# Patient Record
Sex: Female | Born: 1985
Health system: Southern US, Community
[De-identification: ages and names within clinical notes are randomized; demographics above are authoritative.]

## PROBLEM LIST (undated history)

## (undated) DIAGNOSIS — D690 Allergic purpura: Secondary | ICD-10-CM

## (undated) DIAGNOSIS — T783XXA Angioneurotic edema, initial encounter: Secondary | ICD-10-CM

## (undated) DIAGNOSIS — L509 Urticaria, unspecified: Secondary | ICD-10-CM

## (undated) DIAGNOSIS — M3500 Sicca syndrome, unspecified: Secondary | ICD-10-CM

## (undated) DIAGNOSIS — R112 Nausea with vomiting, unspecified: Secondary | ICD-10-CM

## (undated) DIAGNOSIS — N08 Glomerular disorders in diseases classified elsewhere: Secondary | ICD-10-CM

## (undated) DIAGNOSIS — Z9889 Other specified postprocedural states: Secondary | ICD-10-CM

## (undated) HISTORY — PX: MOUTH SURGERY: SHX715

## (undated) HISTORY — PX: LUMBAR DISC SURGERY: SHX700

## (undated) HISTORY — DX: Sjogren syndrome, unspecified: M35.00

## (undated) HISTORY — DX: Angioneurotic edema, initial encounter: T78.3XXA

## (undated) HISTORY — DX: Urticaria, unspecified: L50.9

## (undated) HISTORY — PX: LAMINOTOMY: SHX998

## (undated) HISTORY — PX: NO PAST SURGERIES: SHX2092

---

## 2008-05-05 ENCOUNTER — Inpatient Hospital Stay (HOSPITAL_COMMUNITY): Admission: AD | Admit: 2008-05-05 | Discharge: 2008-05-07 | Payer: Self-pay | Admitting: Obstetrics & Gynecology

## 2008-05-05 ENCOUNTER — Ambulatory Visit: Payer: Self-pay | Admitting: Obstetrics & Gynecology

## 2010-03-04 ENCOUNTER — Other Ambulatory Visit: Admission: RE | Admit: 2010-03-04 | Discharge: 2010-03-04 | Payer: Self-pay | Admitting: Obstetrics and Gynecology

## 2011-05-19 LAB — DIFFERENTIAL
Basophils Absolute: 0
Basophils Relative: 1
Eosinophils Absolute: 0.1
Eosinophils Relative: 1
Lymphocytes Relative: 20
Lymphs Abs: 2
Monocytes Absolute: 0.6
Monocytes Relative: 6
Neutro Abs: 7.4
Neutrophils Relative %: 73

## 2011-05-19 LAB — CBC
HCT: 34.8 — ABNORMAL LOW
HCT: 38.3
Hemoglobin: 11.6 — ABNORMAL LOW
Hemoglobin: 12.8
MCHC: 33.3
MCHC: 33.4
MCV: 88.4
MCV: 88.5
Platelets: 234
Platelets: 297
RBC: 3.94
RBC: 4.33
RDW: 15.2
RDW: 15.7 — ABNORMAL HIGH
WBC: 10.2
WBC: 11.9 — ABNORMAL HIGH

## 2011-05-19 LAB — RH IMMUNE GLOB WKUP(>/=20WKS)(NOT WOMEN'S HOSP): Fetal Screen: NEGATIVE

## 2011-05-19 LAB — RPR: RPR Ser Ql: NONREACTIVE

## 2012-03-17 ENCOUNTER — Encounter (HOSPITAL_COMMUNITY): Payer: Self-pay | Admitting: Emergency Medicine

## 2012-03-17 ENCOUNTER — Observation Stay (HOSPITAL_COMMUNITY)
Admission: EM | Admit: 2012-03-17 | Discharge: 2012-03-18 | Disposition: A | Discharge status: Home / Self Care | Payer: Medicaid Other | Attending: Internal Medicine | Admitting: Internal Medicine

## 2012-03-17 DIAGNOSIS — R42 Dizziness and giddiness: Secondary | ICD-10-CM | POA: Insufficient documentation

## 2012-03-17 DIAGNOSIS — E162 Hypoglycemia, unspecified: Secondary | ICD-10-CM | POA: Diagnosis present

## 2012-03-17 DIAGNOSIS — E161 Other hypoglycemia: Secondary | ICD-10-CM | POA: Insufficient documentation

## 2012-03-17 DIAGNOSIS — O210 Mild hyperemesis gravidarum: Principal | ICD-10-CM | POA: Diagnosis present

## 2012-03-17 HISTORY — DX: Allergic purpura: D69.0

## 2012-03-17 HISTORY — DX: Glomerular disorders in diseases classified elsewhere: N08

## 2012-03-17 LAB — URINALYSIS, ROUTINE W REFLEX MICROSCOPIC
Glucose, UA: NEGATIVE mg/dL
Ketones, ur: >80 mg/dL — AB
Leukocytes, UA: NEGATIVE
Nitrite: NEGATIVE
Protein, ur: 30 mg/dL — AB
Specific Gravity, Urine: >1.03 — ABNORMAL HIGH (ref 1.005–1.030)
Urobilinogen, UA: 0.2 mg/dL (ref 0.0–1.0)
pH: 6 (ref 5.0–8.0)

## 2012-03-17 LAB — CBC WITH DIFFERENTIAL/PLATELET
Basophils Absolute: 0 10*3/uL (ref 0.0–0.1)
Basophils Relative: 0 % (ref 0–1)
Eosinophils Absolute: 0.1 10*3/uL (ref 0.0–0.7)
Eosinophils Relative: 1 % (ref 0–5)
HCT: 38.9 % (ref 36.0–46.0)
Hemoglobin: 13.3 g/dL (ref 12.0–15.0)
Lymphocytes Relative: 21 % (ref 12–46)
Lymphs Abs: 1.9 10*3/uL (ref 0.7–4.0)
MCH: 29.5 pg (ref 26.0–34.0)
MCHC: 34.2 g/dL (ref 30.0–36.0)
MCV: 86.3 fL (ref 78.0–100.0)
Monocytes Absolute: 0.7 10*3/uL (ref 0.1–1.0)
Monocytes Relative: 8 % (ref 3–12)
Neutro Abs: 6.3 10*3/uL (ref 1.7–7.7)
Neutrophils Relative %: 70 % (ref 43–77)
Platelets: 320 10*3/uL (ref 150–400)
RBC: 4.51 MIL/uL (ref 3.87–5.11)
RDW: 12.4 % (ref 11.5–15.5)
WBC: 9 10*3/uL (ref 4.0–10.5)

## 2012-03-17 LAB — OB RESULTS CONSOLE RUBELLA ANTIBODY, IGM: Rubella: IMMUNE

## 2012-03-17 LAB — HEPATIC FUNCTION PANEL
ALT: 24 U/L (ref 0–35)
AST: 17 U/L (ref 0–37)
Albumin: 4.1 g/dL (ref 3.5–5.2)
Alkaline Phosphatase: 95 U/L (ref 39–117)
Bilirubin, Direct: 0.1 mg/dL (ref 0.0–0.3)
Indirect Bilirubin: 0.3 mg/dL (ref 0.3–0.9)
Total Bilirubin: 0.4 mg/dL (ref 0.3–1.2)
Total Protein: 8.2 g/dL (ref 6.0–8.3)

## 2012-03-17 LAB — BASIC METABOLIC PANEL
BUN: 11 mg/dL (ref 6–23)
CO2: 26 mEq/L (ref 19–32)
Calcium: 9.9 mg/dL (ref 8.4–10.5)
Chloride: 100 mEq/L (ref 96–112)
Creatinine, Ser: 0.57 mg/dL (ref 0.50–1.10)
GFR calc Af Amer: >90 mL/min (ref >90)
GFR calc non Af Amer: >90 mL/min (ref >90)
Glucose, Bld: 89 mg/dL (ref 70–99)
Potassium: 3.9 mEq/L (ref 3.5–5.1)
Sodium: 138 mEq/L (ref 135–145)

## 2012-03-17 LAB — LIPASE, BLOOD: Lipase: 25 U/L (ref 11–59)

## 2012-03-17 LAB — OB RESULTS CONSOLE ANTIBODY SCREEN: Antibody Screen: NEGATIVE

## 2012-03-17 LAB — OB RESULTS CONSOLE ABO/RH: RH Type: NEGATIVE

## 2012-03-17 LAB — URINE MICROSCOPIC-ADD ON

## 2012-03-17 LAB — OB RESULTS CONSOLE HIV ANTIBODY (ROUTINE TESTING): HIV: NONREACTIVE

## 2012-03-17 LAB — OB RESULTS CONSOLE HEPATITIS B SURFACE ANTIGEN: Hepatitis B Surface Ag: NEGATIVE

## 2012-03-17 LAB — PREGNANCY, URINE: Preg Test, Ur: POSITIVE — AB

## 2012-03-17 LAB — HCG, QUANTITATIVE, PREGNANCY: hCG, Beta Chain, Quant, S: 89103 m[IU]/mL — ABNORMAL HIGH (ref <5)

## 2012-03-17 LAB — OB RESULTS CONSOLE RPR: RPR: NONREACTIVE

## 2012-03-17 LAB — GLUCOSE, CAPILLARY: Glucose-Capillary: 63 mg/dL — ABNORMAL LOW (ref 70–99)

## 2012-03-17 MED ORDER — DEXTROSE-NACL 5-0.9 % IV SOLN
Freq: Once | INTRAVENOUS | Status: AC
  Administered 2012-03-17: 16:00:00 via INTRAVENOUS

## 2012-03-17 MED ORDER — ACETAMINOPHEN 650 MG RE SUPP: 650.0000 mg | Freq: Four times a day (QID) | RECTAL | Status: DC | PRN

## 2012-03-17 MED ORDER — ACETAMINOPHEN 325 MG PO TABS: 650.0000 mg | ORAL_TABLET | Freq: Four times a day (QID) | ORAL | Status: DC | PRN

## 2012-03-17 MED ORDER — DEXTROSE-NACL 5-0.9 % IV SOLN
INTRAVENOUS | Status: DC
  Administered 2012-03-17: 16:00:00 via INTRAVENOUS

## 2012-03-17 MED ORDER — FAMOTIDINE 20 MG PO TABS
20.0000 mg | ORAL_TABLET | Freq: Two times a day (BID) | ORAL | Status: DC
  Administered 2012-03-17 – 2012-03-18 (×2): 20 mg via ORAL
  Filled 2012-03-17 (×2): qty 1

## 2012-03-17 MED ORDER — RANITIDINE HCL 150 MG/10ML PO SYRP: 150.0000 mg | ORAL_SOLUTION | Freq: Two times a day (BID) | ORAL | Status: DC

## 2012-03-17 MED ORDER — ALUM & MAG HYDROXIDE-SIMETH 200-200-20 MG/5ML PO SUSP: 30.0000 mL | Freq: Four times a day (QID) | ORAL | Status: DC | PRN

## 2012-03-17 MED ORDER — THIAMINE HCL 100 MG/ML IJ SOLN
100.0000 mg | Freq: Once | INTRAMUSCULAR | Status: AC
  Administered 2012-03-17: 100 mg via INTRAVENOUS
  Filled 2012-03-17: qty 2

## 2012-03-17 MED ORDER — METOCLOPRAMIDE HCL 5 MG/ML IJ SOLN
10.0000 mg | Freq: Once | INTRAMUSCULAR | Status: AC
  Administered 2012-03-17: 10 mg via INTRAVENOUS
  Filled 2012-03-17: qty 2

## 2012-03-17 MED ORDER — ONDANSETRON HCL 4 MG/2ML IJ SOLN
4.0000 mg | Freq: Once | INTRAMUSCULAR | Status: AC
  Administered 2012-03-17: 4 mg via INTRAVENOUS
  Filled 2012-03-17: qty 2

## 2012-03-17 MED ORDER — ONDANSETRON 8 MG/NS 50 ML IVPB
8.0000 mg | Freq: Three times a day (TID) | INTRAVENOUS | Status: DC
  Administered 2012-03-17 – 2012-03-18 (×2): 8 mg via INTRAVENOUS
  Filled 2012-03-17 (×3): qty 8

## 2012-03-17 MED ORDER — SODIUM CHLORIDE 0.9 % IV SOLN
Freq: Once | INTRAVENOUS | Status: AC
  Administered 2012-03-17: 16:00:00 via INTRAVENOUS

## 2012-03-17 MED ORDER — DIPHENHYDRAMINE HCL 50 MG/ML IJ SOLN
25.0000 mg | Freq: Once | INTRAMUSCULAR | Status: AC
  Administered 2012-03-17: 25 mg via INTRAVENOUS
  Filled 2012-03-17: qty 1

## 2012-03-17 MED ORDER — PROCHLORPERAZINE EDISYLATE 5 MG/ML IJ SOLN
10.0000 mg | Freq: Four times a day (QID) | INTRAMUSCULAR | Status: DC | PRN
  Filled 2012-03-17: qty 2

## 2012-03-17 MED ORDER — ONDANSETRON HCL 4 MG/5ML PO SOLN: 8.0000 mg | Freq: Three times a day (TID) | ORAL | Status: DC

## 2012-03-17 MED ORDER — DEXTROSE-NACL 5-0.9 % IV SOLN
INTRAVENOUS | Status: DC
  Administered 2012-03-18: via INTRAVENOUS

## 2012-03-17 MED ORDER — ONDANSETRON HCL 4 MG/2ML IJ SOLN
INTRAMUSCULAR | Status: AC
  Filled 2012-03-17: qty 8

## 2012-03-17 NOTE — ED Provider Notes (Signed)
History  This chart was scribed for Natalie Booze, MD by Erskine Emery. This patient was seen in room APA14/APA14 and the patient's care was started at 15:19.   CSN: [id number]  Arrival date & time 03/17/12  1504   First MD Initiated Contact with Patient 03/17/12 1519      Chief Complaint  Patient presents with  . Morning Sickness    (Consider location/radiation/quality/duration/timing/severity/associated sxs/prior treatment) HPI Natalie Foster is a 26 y.o. female who presents to the Emergency Department complaining of nausea for the last 3 weeks and emesis for the last 2 weeks (can't keep any food down) that has been gradually worsening the last 2-3 days with an inability to even keep down fluids. Pt reports associated dizziness and occasional LOC that is present both lying and standing, but worsened by standing.  Pt reports she tried eating recommended foods at different times of day, separating solids and liquids, and emetrol, none of which relieved her symptoms. Pt also reports a mild headache and feeling extremely hot or cold at night, but no sleep disturbance, diarrhea, constipation, or other pains.  Pt had a previous pregnancy with no abnormalities (G2 P1 A0).  Pt is not a smoker, used to drink occasionally, and otherwise is in good health.   Pt sees [facility] in [place] as her current PCP and is on pregnancy Medicaid.   No past medical history on file.  No past surgical history on file.  No family history on file.  History  Substance Use Topics  . Smoking status: Not on file  . Smokeless tobacco: Not on file  . Alcohol Use: Not on file    OB History    No data available      Review of Systems  Gastrointestinal: Positive for nausea and vomiting. Negative for diarrhea and constipation.  Neurological: Positive for syncope and headaches.   A complete 10 system review of systems was obtained and all systems are negative except as noted in the HPI and PMH.     Allergies  Review of patient's allergies indicates not on file.  Home Medications  No current outpatient prescriptions on file.  There were no vitals taken for this visit.  Physical Exam  Nursing note and vitals reviewed. Constitutional: She is oriented to person, place, and time. She appears well-developed. She appears lethargic. No distress.  HENT:  Head: Normocephalic and atraumatic.  Eyes: EOM are normal.  Neck: Neck supple. No tracheal deviation present.  Cardiovascular: Normal rate.   Pulmonary/Chest: Effort normal. No respiratory distress.  Abdominal: Soft.       Bowel sounds decreased  Musculoskeletal: Normal range of motion. She exhibits no edema.  Neurological: She is oriented to person, place, and time. She appears lethargic.  Skin: Skin is warm and dry.  Psychiatric: She has a normal mood and affect. Her behavior is normal.    ED Course  Procedures (including critical care time) DIAGNOSTIC STUDIES: Oxygen Saturation is 98% on room air, normal by my interpretation.    COORDINATION OF CARE: 15:30--Medication order: 0.9% sodium chloride infusion--once  15:37--I evaluated the patient and we discussed a treatment plan including nausea medication (Zofran), IV fluids and nutrients, and urinalysis to which the pt agreed.   15:45--Medication orders: Ondansetron (Zofran) injection 4 mg--once   Dextrose 5%-0.9% sodium chloride infusion--once   Dextrose 5%-0.9% sodium chloride infusion--continuous  16:40--I rechecked the pt; she claims she is still nauseous but not dizzy.  16:45--Medication orders: Metoclopramide (Reglan) injection 10 mg--once  Diphenhydramine (Benadryl) injection 25 mg--once  18:13--I rechecked the pt who was feeling better but still hungry. We decided she could try to eat something then be discharged home if it stays down well.   18:43--I rechecked the pt who is having difficulty ingesting but has not had any emesis. We decided she would be  admitted to the hospital for further observance.   Results for orders placed during the hospital encounter of 03/17/12  URINALYSIS, ROUTINE W REFLEX MICROSCOPIC      Component Value Range   Color, Urine YELLOW  YELLOW   APPearance CLEAR  CLEAR   Specific Gravity, Urine >1.030 (*) 1.005 - 1.030   pH 6.0  5.0 - 8.0   Glucose, UA NEGATIVE  NEGATIVE mg/dL   Hgb urine dipstick TRACE (*) NEGATIVE   Bilirubin Urine SMALL (*) NEGATIVE   Ketones, ur >80 (*) NEGATIVE mg/dL   Protein, ur 30 (*) NEGATIVE mg/dL   Urobilinogen, UA 0.2  0.0 - 1.0 mg/dL   Nitrite NEGATIVE  NEGATIVE   Leukocytes, UA NEGATIVE  NEGATIVE  PREGNANCY, URINE      Component Value Range   Preg Test, Ur POSITIVE (*) NEGATIVE  CBC WITH DIFFERENTIAL      Component Value Range   WBC 9.0  4.0 - 10.5 K/uL   RBC 4.51  3.87 - 5.11 MIL/uL   Hemoglobin 13.3  12.0 - 15.0 g/dL   HCT 16.1  09.6 - 04.5 %   MCV 86.3  78.0 - 100.0 fL   MCH 29.5  26.0 - 34.0 pg   MCHC 34.2  30.0 - 36.0 g/dL   RDW 40.9  81.1 - 91.4 %   Platelets 320  150 - 400 K/uL   Neutrophils Relative 70  43 - 77 %   Neutro Abs 6.3  1.7 - 7.7 K/uL   Lymphocytes Relative 21  12 - 46 %   Lymphs Abs 1.9  0.7 - 4.0 K/uL   Monocytes Relative 8  3 - 12 %   Monocytes Absolute 0.7  0.1 - 1.0 K/uL   Eosinophils Relative 1  0 - 5 %   Eosinophils Absolute 0.1  0.0 - 0.7 K/uL   Basophils Relative 0  0 - 1 %   Basophils Absolute 0.0  0.0 - 0.1 K/uL  BASIC METABOLIC PANEL      Component Value Range   Sodium 138  135 - 145 mEq/L   Potassium 3.9  3.5 - 5.1 mEq/L   Chloride 100  96 - 112 mEq/L   CO2 26  19 - 32 mEq/L   Glucose, Bld 89  70 - 99 mg/dL   BUN 11  6 - 23 mg/dL   Creatinine, Ser 7.82  0.50 - 1.10 mg/dL   Calcium 9.9  8.4 - 95.6 mg/dL   GFR calc non Af Amer >90  >90 mL/min   GFR calc Af Amer >90  >90 mL/min  HCG, QUANTITATIVE, PREGNANCY      Component Value Range   hCG, Beta Chain, Quant, S 21308 (*) <5 mIU/mL  GLUCOSE, CAPILLARY      Component Value Range    Glucose-Capillary 63 (*) 70 - 99 mg/dL  URINE MICROSCOPIC-ADD ON      Component Value Range   Squamous Epithelial / LPF FEW (*) RARE   WBC, UA 0-2  <3 WBC/hpf   RBC / HPF 0-2  <3 RBC/hpf   Bacteria, UA FEW (*) RARE   Urine-Other MUCOUS PRESENT  1. Hyperemesis gravidarum       MDM  Hyperemesis gravidarum. She wouldn't need IV fluids with dextrose and will be given antiemetics.  She had no improvement with ondansetron, and moderate improvement with metoclopramide. She was given a fluid challenge and was only able to take a small amount of fluid. She did not vomit, but nausea did return. She does not feel comfortable going home syringes we made for observation admission. Case discussed with Dr. Phillips Odor who agrees to admit the patient. Of note, hCG level is quite high for this stage of pregnancy. This could conceivably E. related to twin or triplet gestation, also need to consider possibility of a molar pregnancy. This will need to be evaluated later in her gestation.  I personally performed the services described in this documentation, which was scribed in my presence. The recorded information has been reviewed and considered.            Natalie Booze, MD 03/17/12 [id number]

## 2012-03-17 NOTE — ED Notes (Signed)
Pt [redacted] weeks pregnant. C/o severe morning sickness x 2 weeks increasingly worse x 2-3 days. Pt states she is unable to tolerate liquids and she is throwing up bile. Denies vaginal bleeding or d/c.

## 2012-03-17 NOTE — H&P (Signed)
[facility] History and Physical  Natalie Foster ZOX:[id number] DOB: 05/26/1986 DOA: 03/17/2012  Referring physician: Etheleen Mayhew PCP: No primary provider on file.   Chief Complaint: Nausea, Vomiting HPI: 26 yo G2P1A0, [redacted] weeks pregnant with refractory nausea vomiting. No significant food intake in 3 days due to persistent nausea. This evening started getting dizzy after standing and was presyncopal, vision was dark and she was seeing flashes of light, did not have LOC. She has no significant health problems, a healthy 1st pregnancy 4 years ago, has been on pre-natal vitamins, she does not have a PCP and has not had her 1st OB visit-she has an appointment with Dr. Duane Lope at [facility] in 2 weeks. She reports taking BC pills when she concieved, recently stopped when she had positive UPT.   She denies and chest pain, SOB, no fevers chills, no sick contacts, no acid reflux symptoms, severe nausea, no epigastric pain, normal bowel movements, no hematemesis, mild LE swelling, no focal neuro complaints, generalized weakness.   Review of Systems:  Pertinent positives and negative in HPI. Otherwise negative ROS.  Past Medical History  Diagnosis Date  . HSP (Henoch-Schonlein purpura) nephritis    History reviewed. No pertinent past surgical history. Social History:  reports that she has never smoked. She does not have any smokeless tobacco history on file. She reports that she does not drink alcohol or use illicit drugs. Lives at home with her husband and 9 year old son.  Allergies  Allergen Reactions  . Cortisone Nausea And Vomiting  . Pineapple Hives and Itching    History reviewed. No pertinent family history. No history of Cancer, CAD, Diabetes.  Prior to Admission medications   Medication Sig Start Date End Date Taking? Authorizing Provider  Prenatal Vit-Fe Fumarate-FA (PRENATAL MULTIVITAMIN) TABS Take 1 tablet by mouth daily.   Yes Historical Provider, MD   Physical  Exam: Filed Vitals:   03/17/12 1520 03/17/12 1521 03/17/12 1734 03/17/12 1915  BP: 118/77  98/54 105/55  Pulse: 86  82 88  Temp: 97.7 F (36.5 C)     TempSrc: Oral     Resp: 18  16 17   Height:  5\' 5"  (1.651 m)    Weight:  87.091 kg (192 lb)    SpO2: 98%  100% 100%     General: Tired but healthy appearing 26 yo, NAD  Eyes: PERRL, conjunctiva normal  ENT: MMM, good dentition  Neck: supple, no thyromegaly  Cardiovascular: RRR, no mrg  Respiratory: CTAB  Abdomen: soft NT, active BS, no HSM  Skin: slightly diaphoretic, no rashes or lesions  Musculoskeletal: normal  Psychiatric: normal  Neurologic: non-focal  Labs on Admission:  Basic Metabolic Panel:  Lab 03/17/12 8119  NA 138  K 3.9  CL 100  CO2 26  GLUCOSE 89  BUN 11  CREATININE 0.57  CALCIUM 9.9  MG --  PHOS --   Liver Function Tests: pending CBC:  Lab 03/17/12 1525  WBC 9.0  NEUTROABS 6.3  HGB 13.3  HCT 38.9  MCV 86.3  PLT 320   CBG:  Lab 03/17/12 1611  GLUCAP 63*    Radiological Exams on Admission: No results found.  EKG: Independently reviewed.   Assessment/Plan Active Problems:  Hyperemesis gravidarum  26 yo with no significant medical problems who presents [redacted] weeks pregnant with refractory nausea vomiting and clinically dehydrated with >80 ketones in urine, high SG in urine, symptoms of orthostasis and hypotension.  She has not yet established care  with an OB/GYN but has an appointment in two weeks. This appears to be uncomplicated with mostly symptoms of dehydration, her serum electrolytes are normal and her BP responded to fluids in ED. CBGs are also improved with D5 in fluids.    Plan: 1. Admit for Observation 2. IV fluid Hydration with Thiamine D5-NS 3.  Will check LFT to r/o Cholecystitis, but she does not have RUQ pain or other signs of infection or symptoms 4. Check Lipase to r/o pancreattis 5. Check TSH to r/o Thyroid dysfunction 6. Will schedule  IV Zofran q8  hours 7. Compazine for breakthrough nausea/vomiting 8. Check CBG and BMET in AM 9. Ranitidine BID to control acid production  Code Status: Full Code Family Communication: Discussed plan with patient and her husband in detail  Disposition Plan:  Home in AM with OB/GYN Follow-up ASAP for early ultrasound and outpatient treatment of HG.  Time spent: 60 minutes  [facility] Pager [phone]  If 7PM-7AM, please contact night-coverage [email or web address] Password [facility] 03/17/2012, 7:49 PM

## 2012-03-17 NOTE — ED Notes (Signed)
Pt given some water and crackers per request. Pt held down water and some crackers without any emesis.

## 2012-03-18 DIAGNOSIS — E162 Hypoglycemia, unspecified: Secondary | ICD-10-CM

## 2012-03-18 LAB — TSH: TSH: 0.4 u[IU]/mL (ref 0.350–4.500)

## 2012-03-18 LAB — BASIC METABOLIC PANEL
BUN: 5 mg/dL — ABNORMAL LOW (ref 6–23)
CO2: 23 mEq/L (ref 19–32)
Calcium: 8.7 mg/dL (ref 8.4–10.5)
Chloride: 106 mEq/L (ref 96–112)
Creatinine, Ser: 0.55 mg/dL (ref 0.50–1.10)
GFR calc Af Amer: >90 mL/min (ref >90)
GFR calc non Af Amer: >90 mL/min (ref >90)
Glucose, Bld: 92 mg/dL (ref 70–99)
Potassium: 3.5 mEq/L (ref 3.5–5.1)
Sodium: 137 mEq/L (ref 135–145)

## 2012-03-18 LAB — GLUCOSE, CAPILLARY: Glucose-Capillary: 81 mg/dL (ref 70–99)

## 2012-03-18 MED ORDER — ONDANSETRON HCL 4 MG PO TABS
4.0000 mg | ORAL_TABLET | ORAL | Status: DC | PRN
  Administered 2012-03-18: 4 mg via ORAL
  Filled 2012-03-18: qty 1

## 2012-03-18 MED ORDER — ONDANSETRON HCL 4 MG PO TABS: 4.0000 mg | ORAL_TABLET | ORAL | Status: AC | PRN

## 2012-03-18 MED ORDER — SODIUM CHLORIDE 0.9 % IJ SOLN
INTRAMUSCULAR | Status: AC
  Filled 2012-03-18: qty 3

## 2012-03-18 NOTE — Progress Notes (Signed)
Pt. Discharged to personal vehicle via w/c. Currently voices no c/o nausea, vomiting, or pain. Discharge instructions and meds reviewed with pt with good understanding. No acute distress noted.

## 2012-03-18 NOTE — Discharge Summary (Signed)
Physician Discharge Summary  Patient ID: Natalie Foster MRN: [id number] DOB/AGE: [birth year] 26 y.o.  Admit date: 03/17/2012 Discharge date: 03/18/2012  Discharge Diagnoses:  Active Problems:  Hyperemesis gravidarum  Hypoglycemia   Medication List  As of 03/18/2012  8:45 AM   TAKE these medications         ondansetron 4 MG tablet   Commonly known as: ZOFRAN   Take 1-2 tablets (4-8 mg total) by mouth every 4 (four) hours as needed for nausea.      prenatal multivitamin Tabs   Take 1 tablet by mouth daily.            Discharge Orders    Future Orders Please Complete By Expires   Diet general         Follow-up Information    Follow up with Lazaro Arms, MD in 2 weeks.   Contact information:   [facility] [address] [place] [address] [phone]          Disposition: home  Discharged Condition:  stable  Consults:  none  Labs:   Results for orders placed during the hospital encounter of 03/17/12 (from the past 48 hour(s))  CBC WITH DIFFERENTIAL     Status: Normal   Collection Time   03/17/12  3:25 PM      Component Value Range Comment   WBC 9.0  4.0 - 10.5 K/uL    RBC 4.51  3.87 - 5.11 MIL/uL    Hemoglobin 13.3  12.0 - 15.0 g/dL    HCT 21.3  08.6 - 57.8 %    MCV 86.3  78.0 - 100.0 fL    MCH 29.5  26.0 - 34.0 pg    MCHC 34.2  30.0 - 36.0 g/dL    RDW 46.9  62.9 - 52.8 %    Platelets 320  150 - 400 K/uL    Neutrophils Relative 70  43 - 77 %    Neutro Abs 6.3  1.7 - 7.7 K/uL    Lymphocytes Relative 21  12 - 46 %    Lymphs Abs 1.9  0.7 - 4.0 K/uL    Monocytes Relative 8  3 - 12 %    Monocytes Absolute 0.7  0.1 - 1.0 K/uL    Eosinophils Relative 1  0 - 5 %    Eosinophils Absolute 0.1  0.0 - 0.7 K/uL    Basophils Relative 0  0 - 1 %    Basophils Absolute 0.0  0.0 - 0.1 K/uL   BASIC METABOLIC PANEL     Status: Normal   Collection Time   03/17/12  3:25 PM      Component Value Range Comment   Sodium 138  135 - 145 mEq/L    Potassium 3.9  3.5 - 5.1 mEq/L    Chloride 100  96 - 112 mEq/L    CO2 26  19 - 32 mEq/L    Glucose, Bld 89  70 - 99 mg/dL    BUN 11  6 - 23 mg/dL    Creatinine, Ser 4.13  0.50 - 1.10 mg/dL    Calcium 9.9  8.4 - 24.4 mg/dL    GFR calc non Af Amer >90  >90 mL/min    GFR calc Af Amer >90  >90 mL/min   HCG, QUANTITATIVE, PREGNANCY     Status: Abnormal   Collection Time   03/17/12  3:25 PM      Component Value Range Comment   hCG, Beta  Francene Finders [address] (*) <5 mIU/mL   URINALYSIS, ROUTINE W REFLEX MICROSCOPIC     Status: Abnormal   Collection Time   03/17/12  3:46 PM      Component Value Range Comment   Color, Urine YELLOW  YELLOW    APPearance CLEAR  CLEAR    Specific Gravity, Urine >1.030 (*) 1.005 - 1.030    pH 6.0  5.0 - 8.0    Glucose, UA NEGATIVE  NEGATIVE mg/dL    Hgb urine dipstick TRACE (*) NEGATIVE    Bilirubin Urine SMALL (*) NEGATIVE    Ketones, ur >80 (*) NEGATIVE mg/dL    Protein, ur 30 (*) NEGATIVE mg/dL    Urobilinogen, UA 0.2  0.0 - 1.0 mg/dL    Nitrite NEGATIVE  NEGATIVE    Leukocytes, UA NEGATIVE  NEGATIVE   PREGNANCY, URINE     Status: Abnormal   Collection Time   03/17/12  3:46 PM      Component Value Range Comment   Preg Test, Ur POSITIVE (*) NEGATIVE   URINE MICROSCOPIC-ADD ON     Status: Abnormal   Collection Time   03/17/12  3:46 PM      Component Value Range Comment   Squamous Epithelial / LPF FEW (*) RARE    WBC, UA 0-2  <3 WBC/hpf    RBC / HPF 0-2  <3 RBC/hpf    Bacteria, UA FEW (*) RARE    Urine-Other MUCOUS PRESENT     GLUCOSE, CAPILLARY     Status: Abnormal   Collection Time   03/17/12  4:11 PM      Component Value Range Comment   Glucose-Capillary 63 (*) 70 - 99 mg/dL   HEPATIC FUNCTION PANEL     Status: Normal   Collection Time   03/17/12  8:23 PM      Component Value Range Comment   Total Protein 8.2  6.0 - 8.3 g/dL    Albumin 4.1  3.5 - 5.2 g/dL    AST 17  0 - 37 U/L    ALT 24  0 - 35 U/L    Alkaline Phosphatase 95  39 - 117 U/L     Total Bilirubin 0.4  0.3 - 1.2 mg/dL    Bilirubin, Direct 0.1  0.0 - 0.3 mg/dL    Indirect Bilirubin 0.3  0.3 - 0.9 mg/dL   LIPASE, BLOOD     Status: Normal   Collection Time   03/17/12  8:23 PM      Component Value Range Comment   Lipase 25  11 - 59 U/L   BASIC METABOLIC PANEL     Status: Abnormal   Collection Time   03/18/12  4:26 AM      Component Value Range Comment   Sodium 137  135 - 145 mEq/L    Potassium 3.5  3.5 - 5.1 mEq/L    Chloride 106  96 - 112 mEq/L    CO2 23  19 - 32 mEq/L    Glucose, Bld 92  70 - 99 mg/dL    BUN 5 (*) 6 - 23 mg/dL    Creatinine, Ser 4.74  0.50 - 1.10 mg/dL    Calcium 8.7  8.4 - 25.9 mg/dL    GFR calc non Af Amer >90  >90 mL/min    GFR calc Af Amer >90  >90 mL/min   GLUCOSE, CAPILLARY     Status: Normal   Collection Time   03/18/12  7:27 AM  Component Value Range Comment   Glucose-Capillary 81  70 - 99 mg/dL    Hospital Course: See H&P for complete admission details. The patient is a 26 year old white female [redacted] weeks pregnant with refractory nausea vomiting. She had been unable to even keep down liquids for 3 days. She started having orthostatic dizziness, presyncope. She has no primary care physician but is scheduled to see Dr. Despina Hidden in 2 weeks. In the emergency room, she had minimal improvement with odanseron and metoclopramide. Therefore she was placed on observation on the hospitalist service. Her symptoms improved and she was able to tolerate clears. I will advance her to solids and discharge her later today. She is to keep her appointment with Dr. Despina Hidden. Call his office if she continues to have symptoms.  Discharge Exam:  Blood pressure 93/69, pulse 70, temperature 98.5 F (36.9 C), temperature source Oral, resp. rate 16, height 5\' 5"  (1.651 m), weight 87.091 kg (192 lb), last menstrual period 01/26/2012, SpO2 99.00%.  General: Comfortable. Lungs clear to auscultation bilaterally without wheeze rhonchi or rales Cardiovascular regular rate rhythm  without murmurs gallops rubs Abdomen soft nontender nondistended Extremities no clubbing cyanosis or edema  Signed: Deyona Soza L 03/18/2012, 8:45 AM

## 2012-03-18 NOTE — Progress Notes (Signed)
UR Chart Review Completed  

## 2012-03-30 LAB — OB RESULTS CONSOLE GC/CHLAMYDIA
Chlamydia: NEGATIVE
Gonorrhea: NEGATIVE

## 2012-04-05 ENCOUNTER — Other Ambulatory Visit: Payer: Self-pay | Admitting: Adult Health

## 2012-04-05 ENCOUNTER — Other Ambulatory Visit (HOSPITAL_COMMUNITY)
Admission: RE | Admit: 2012-04-05 | Discharge: 2012-04-05 | Disposition: A | Discharge status: Home / Self Care | Payer: Medicaid Other | Source: Ambulatory Visit | Attending: Obstetrics and Gynecology | Admitting: Obstetrics and Gynecology

## 2012-04-05 DIAGNOSIS — Z113 Encounter for screening for infections with a predominantly sexual mode of transmission: Secondary | ICD-10-CM | POA: Insufficient documentation

## 2012-04-05 DIAGNOSIS — Z01419 Encounter for gynecological examination (general) (routine) without abnormal findings: Secondary | ICD-10-CM | POA: Insufficient documentation

## 2012-09-29 LAB — OB RESULTS CONSOLE GBS: GBS: NEGATIVE

## 2012-10-06 LAB — OB RESULTS CONSOLE GC/CHLAMYDIA
Chlamydia: NEGATIVE
Gonorrhea: NEGATIVE

## 2012-10-06 LAB — OB RESULTS CONSOLE GBS: GBS: NEGATIVE

## 2012-10-27 ENCOUNTER — Encounter (HOSPITAL_COMMUNITY): Payer: Self-pay | Admitting: *Deleted

## 2012-10-27 ENCOUNTER — Inpatient Hospital Stay (HOSPITAL_COMMUNITY)
Admission: AD | Admit: 2012-10-27 | Discharge: 2012-10-29 | DRG: 775 | Disposition: A | Discharge status: Home / Self Care | Payer: Medicaid Other | Source: Ambulatory Visit | Attending: Obstetrics and Gynecology | Admitting: Obstetrics and Gynecology

## 2012-10-27 LAB — CBC
HCT: 30.8 % — ABNORMAL LOW (ref 36.0–46.0)
Hemoglobin: 9.6 g/dL — ABNORMAL LOW (ref 12.0–15.0)
MCH: 22.6 pg — ABNORMAL LOW (ref 26.0–34.0)
MCHC: 31.2 g/dL (ref 30.0–36.0)
MCV: 72.5 fL — ABNORMAL LOW (ref 78.0–100.0)
Platelets: 306 10*3/uL (ref 150–400)
RBC: 4.25 MIL/uL (ref 3.87–5.11)
RDW: 14.3 % (ref 11.5–15.5)
WBC: 11.5 10*3/uL — ABNORMAL HIGH (ref 4.0–10.5)

## 2012-10-27 MED ORDER — FENTANYL CITRATE 0.05 MG/ML IJ SOLN
100.0000 ug | INTRAMUSCULAR | Status: DC | PRN
  Administered 2012-10-27: 100 ug via INTRAVENOUS
  Filled 2012-10-27 (×2): qty 2

## 2012-10-27 MED ORDER — IBUPROFEN 600 MG PO TABS
600.0000 mg | ORAL_TABLET | Freq: Four times a day (QID) | ORAL | Status: DC | PRN
  Administered 2012-10-27: 600 mg via ORAL
  Filled 2012-10-27: qty 1

## 2012-10-27 MED ORDER — CITRIC ACID-SODIUM CITRATE 334-500 MG/5ML PO SOLN: 30.0000 mL | ORAL | Status: DC | PRN

## 2012-10-27 MED ORDER — ONDANSETRON HCL 4 MG PO TABS: 4.0000 mg | ORAL_TABLET | ORAL | Status: DC | PRN

## 2012-10-27 MED ORDER — OXYTOCIN 40 UNITS IN LACTATED RINGERS INFUSION - SIMPLE MED
62.5000 mL/h | INTRAVENOUS | Status: DC
  Filled 2012-10-27: qty 1000

## 2012-10-27 MED ORDER — SIMETHICONE 80 MG PO CHEW: 80.0000 mg | CHEWABLE_TABLET | ORAL | Status: DC | PRN

## 2012-10-27 MED ORDER — WITCH HAZEL-GLYCERIN EX PADS
1.0000 "application " | MEDICATED_PAD | CUTANEOUS | Status: DC | PRN
  Administered 2012-10-28: 1 via TOPICAL

## 2012-10-27 MED ORDER — ZOLPIDEM TARTRATE 5 MG PO TABS: 5.0000 mg | ORAL_TABLET | Freq: Every evening | ORAL | Status: DC | PRN

## 2012-10-27 MED ORDER — ONDANSETRON HCL 4 MG/2ML IJ SOLN: 4.0000 mg | Freq: Four times a day (QID) | INTRAMUSCULAR | Status: DC | PRN

## 2012-10-27 MED ORDER — LACTATED RINGERS IV SOLN: 500.0000 mL | INTRAVENOUS | Status: DC | PRN

## 2012-10-27 MED ORDER — LIDOCAINE HCL (PF) 1 % IJ SOLN
30.0000 mL | INTRAMUSCULAR | Status: DC | PRN
  Administered 2012-10-27: 30 mL via SUBCUTANEOUS
  Filled 2012-10-27 (×2): qty 30

## 2012-10-27 MED ORDER — DIBUCAINE 1 % RE OINT
1.0000 "application " | TOPICAL_OINTMENT | RECTAL | Status: DC | PRN
  Filled 2012-10-27: qty 28

## 2012-10-27 MED ORDER — PRENATAL MULTIVITAMIN CH
1.0000 | ORAL_TABLET | Freq: Every day | ORAL | Status: DC
  Administered 2012-10-28 – 2012-10-29 (×2): 1 via ORAL
  Filled 2012-10-27 (×2): qty 1

## 2012-10-27 MED ORDER — DIPHENHYDRAMINE HCL 25 MG PO CAPS: 25.0000 mg | ORAL_CAPSULE | Freq: Four times a day (QID) | ORAL | Status: DC | PRN

## 2012-10-27 MED ORDER — OXYTOCIN BOLUS FROM INFUSION
500.0000 mL | INTRAVENOUS | Status: DC
  Administered 2012-10-27: 500 mL via INTRAVENOUS

## 2012-10-27 MED ORDER — LANOLIN HYDROUS EX OINT: TOPICAL_OINTMENT | CUTANEOUS | Status: DC | PRN

## 2012-10-27 MED ORDER — LACTATED RINGERS IV SOLN
INTRAVENOUS | Status: DC
  Administered 2012-10-27: 17:00:00 via INTRAVENOUS

## 2012-10-27 MED ORDER — ACETAMINOPHEN 325 MG PO TABS: 650.0000 mg | ORAL_TABLET | ORAL | Status: DC | PRN

## 2012-10-27 MED ORDER — TETANUS-DIPHTH-ACELL PERTUSSIS 5-2.5-18.5 LF-MCG/0.5 IM SUSP
0.5000 mL | Freq: Once | INTRAMUSCULAR | Status: AC
  Administered 2012-10-29: 0.5 mL via INTRAMUSCULAR
  Filled 2012-10-27: qty 0.5

## 2012-10-27 MED ORDER — OXYCODONE-ACETAMINOPHEN 5-325 MG PO TABS
1.0000 | ORAL_TABLET | ORAL | Status: DC | PRN
  Administered 2012-10-27: 2 via ORAL
  Filled 2012-10-27: qty 2

## 2012-10-27 MED ORDER — SENNOSIDES-DOCUSATE SODIUM 8.6-50 MG PO TABS
2.0000 | ORAL_TABLET | Freq: Every day | ORAL | Status: DC
  Administered 2012-10-28: 2 via ORAL

## 2012-10-27 MED ORDER — OXYCODONE-ACETAMINOPHEN 5-325 MG PO TABS
1.0000 | ORAL_TABLET | ORAL | Status: DC | PRN
  Administered 2012-10-28: 1 via ORAL
  Filled 2012-10-27: qty 1

## 2012-10-27 MED ORDER — IBUPROFEN 600 MG PO TABS
600.0000 mg | ORAL_TABLET | Freq: Four times a day (QID) | ORAL | Status: DC
  Administered 2012-10-28 – 2012-10-29 (×6): 600 mg via ORAL
  Filled 2012-10-27 (×6): qty 1

## 2012-10-27 MED ORDER — BENZOCAINE-MENTHOL 20-0.5 % EX AERO
1.0000 "application " | INHALATION_SPRAY | CUTANEOUS | Status: DC | PRN
  Administered 2012-10-28: 1 via TOPICAL
  Filled 2012-10-27: qty 56

## 2012-10-27 MED ORDER — ONDANSETRON HCL 4 MG/2ML IJ SOLN: 4.0000 mg | INTRAMUSCULAR | Status: DC | PRN

## 2012-10-27 NOTE — MAU Note (Signed)
Pt reports having q 4-5 min . Started yesterday now closer and stronger together.Denies SROM reports some bloody show. Good fetal movement reported

## 2012-10-27 NOTE — H&P (Signed)
Natalie Foster is a 27 y.o. female presenting for contractions. Contractions started last night and have become stronger today. Denies vaginal bleeding or leaking fluid. Feels good fetal movement. Was 3cm in office yesterday.  Pregnancy has been uncomplicated to date.  Maternal Medical History:  Reason for admission: Contractions.  Nausea.  Contractions: Onset was 6-12 hours ago.   Frequency: regular.   Duration is approximately 1 minute.   Perceived severity is moderate.    Fetal activity: Perceived fetal activity is normal.   Last perceived fetal movement was within the past hour.    Prenatal complications: No bleeding or hypertension.   Prenatal Complications - Diabetes: none.    OB History   Grav Para Term Preterm Abortions TAB SAB Ect Mult Living   2 1 1       1      Past Medical History  Diagnosis Date  . HSP (Henoch-Schonlein purpura) nephritis    Past Surgical History  Procedure Laterality Date  . No past surgeries     Family History: family history is not on file. Social History:  reports that she has never smoked. She does not have any smokeless tobacco history on file. She reports that she does not drink alcohol or use illicit drugs.   Prenatal Transfer Tool  Maternal Diabetes: No Genetic Screening: Normal Maternal Ultrasounds/Referrals: Normal Fetal Ultrasounds or other Referrals:  None Maternal Substance Abuse:  No Significant Maternal Medications:  None Significant Maternal Lab Results:  Lab values include: Group B Strep negative, Rh negative Other Comments:  None  Review of Systems  Constitutional: Negative for fever and chills.  Eyes: Negative for blurred vision.  Gastrointestinal: Positive for abdominal pain. Negative for heartburn, nausea and vomiting.  Neurological: Negative for headaches.    Dilation: 4 Effacement (%): 80 Exam by:: R.Heide,SNM Last menstrual period 01/26/2012. Maternal Exam:  Uterine Assessment: Contraction strength is  mild.  Contraction duration is 1 minute. Contraction frequency is regular.   Abdomen: Fetal presentation: vertex  Introitus: Normal vulva. Pelvis: adequate for delivery.   Cervix: Cervix evaluated by digital exam.     Fetal Exam Fetal Monitor Review: Mode: ultrasound.   Baseline rate: 140.  Variability: moderate (6-25 bpm).   Pattern: accelerations present and no decelerations.    Fetal State Assessment: Category I - tracings are normal.     Physical Exam  Constitutional: She is oriented to person, place, and time. She appears well-developed and well-nourished. She appears distressed.  Cardiovascular: Normal rate and regular rhythm.   Respiratory: Effort normal and breath sounds normal. No respiratory distress.  GI: Soft. There is no tenderness.  Genitourinary:  4/80/-1  Musculoskeletal: She exhibits no edema.  Neurological: She is alert and oriented to person, place, and time.  Psychiatric: She has a normal mood and affect. Her behavior is normal.    Prenatal labs: ABO, Rh: O/Negative/-- (07/31 0000) Antibody:   Rubella: Immune (07/31 0000) RPR: Nonreactive (07/31 0000)  HBsAg: Negative (07/31 0000)  HIV: Non-reactive (07/31 0000)  GBS: Negative (02/12 0000)   Assessment/Plan: 1. G2P1001 at 40.0 weeks in active labor 2. GBS negative  -admit to labor and delivery -may have epidural or IV analgesia as desired   Alene Mires 10/27/2012, 4:28 PM

## 2012-10-28 LAB — RPR: RPR Ser Ql: NONREACTIVE

## 2012-10-28 MED ORDER — RHO D IMMUNE GLOBULIN 1500 UNIT/2ML IJ SOLN
300.0000 ug | Freq: Once | INTRAMUSCULAR | Status: AC
  Administered 2012-10-28: 300 ug via INTRAMUSCULAR
  Filled 2012-10-28: qty 2

## 2012-10-28 NOTE — Progress Notes (Signed)
UR completed 

## 2012-10-28 NOTE — H&P (Signed)
I examined pt and agree with documentation above and nurse midwife student plan of care. MUHAMMAD,WALIDAH  

## 2012-10-28 NOTE — Progress Notes (Signed)
Post Partum Day 1  Subjective: no complaints, up ad lib, voiding and No BM or flatus.  Lower back pain, thinks it's due to being in bed for an extended time. Ibuprofen isn't helping much.  Objective: Blood pressure 113/77, pulse 98, temperature 97.6 F (36.4 C), temperature source Oral, resp. rate 18, height 5\' 6"  (1.676 m), weight 100.699 kg (222 lb), last menstrual period 01/26/2012, SpO2 97.00%, unknown if currently breastfeeding.  Physical Exam:  General: alert, cooperative, appears stated age and no distress Uterine Fundus: Pt was breastfeeding, was not able to palpate abdomen DVT Evaluation: No evidence of DVT seen on physical exam. Negative Homan's sign. No cords or calf tenderness.   Recent Labs  10/27/12 1650  HGB 9.6*  HCT 30.8*    Assessment/Plan: Plan for discharge tomorrow Will request Percocet as needed Advised to get out of bed and move around when able.   LOS: 1 day   Lorna Dibble, PA-S 10/28/2012, 7:47 AM   Seen and examined by me also Agree with note Wynelle Bourgeois CNM

## 2012-10-29 LAB — TYPE AND SCREEN
ABO/RH(D): O NEG
Antibody Screen: POSITIVE
DAT, IgG: NEGATIVE
Unit division: 0
Unit division: 0

## 2012-10-29 LAB — RH IG WORKUP (INCLUDES ABO/RH)
ABO/RH(D): O NEG
Fetal Screen: NEGATIVE
Gestational Age(Wks): 40
Unit division: 0

## 2012-10-29 MED ORDER — IBUPROFEN 600 MG PO TABS: 600.0000 mg | ORAL_TABLET | Freq: Four times a day (QID) | ORAL | Status: DC

## 2012-10-29 NOTE — Discharge Summary (Signed)
Obstetric Discharge Summary Reason for Admission: onset of labor Prenatal Procedures: none Intrapartum Procedures: spontaneous vaginal delivery  Postpartum Procedures: Rho(D) Ig Complications-Operative and Postpartum: none  Hemoglobin  Date Value Range Status  10/27/2012 9.6* 12.0 - 15.0 g/dL Final     HCT  Date Value Range Status  10/27/2012 30.8* 36.0 - 46.0 % Final    Physical Exam:  General: alert, cooperative and no distress Lochia: appropriate Uterine Fundus: firm Incision: N/A DVT Evaluation: No evidence of DVT seen on physical exam. Negative Homan's sign. No cords or calf tenderness.  Discharge Diagnoses: Term Pregnancy-delivered  Discharge Information: Date: 10/29/2012 Activity: unrestricted Diet: routine Medications: PNV and Ibuprofen Condition: stable Instructions: refer to practice specific booklet Discharge to: home Birth control: condoms for now, will decide before postpartum visit   Newborn Data: Live born female  Birth Weight: 9 lb 4.3 oz (4205 g) APGAR: 8, 9  Home with mother.  Kevin Fenton 10/29/2012, 7:15 AM  I saw and examined patient along with student and agree with above note.   FRAZIER,NATALIE 11/04/2012 6:55 AM

## 2012-10-29 NOTE — Progress Notes (Deleted)
Obstetric Discharge Summary Reason for Admission: onset of labor Prenatal Procedures: none Intrapartum Procedures: spontaneous vaginal delivery Postpartum Procedures: Rho(D) Ig Complications-Operative and Postpartum: none Hemoglobin  Date Value Range Status  10/27/2012 9.6* 12.0 - 15.0 g/dL Final     HCT  Date Value Range Status  10/27/2012 30.8* 36.0 - 46.0 % Final    Physical Exam:  General: alert, cooperative and no distress Lochia: appropriate Uterine Fundus: firm Incision: N/A DVT Evaluation: No evidence of DVT seen on physical exam. Negative Homan's sign. No cords or calf tenderness.  Discharge Diagnoses: Term Pregnancy-delivered  Discharge Information: Date: 10/29/2012 Activity: unrestricted Diet: routine Medications: PNV and Ibuprofen Condition: stable Instructions: refer to practice specific booklet Discharge to: home Birth control: condoms for now, will decide before postpartum visit   Newborn Data: Live born female  Birth Weight: 9 lb 4.3 oz (4205 g) APGAR: 8, 9  Home with mother.  Kevin Fenton 10/29/2012, 7:15 AM

## 2012-11-03 ENCOUNTER — Encounter: Payer: Self-pay | Admitting: Advanced Practice Midwife

## 2012-12-07 ENCOUNTER — Encounter: Payer: Self-pay | Admitting: *Deleted

## 2012-12-08 ENCOUNTER — Ambulatory Visit (INDEPENDENT_AMBULATORY_CARE_PROVIDER_SITE_OTHER): Payer: Medicaid Other | Admitting: Advanced Practice Midwife

## 2012-12-08 ENCOUNTER — Encounter: Payer: Self-pay | Admitting: Advanced Practice Midwife

## 2012-12-08 NOTE — Progress Notes (Signed)
Natalie Foster is a 27 y.o. who presents for a postpartum visit. She is 6 weeks postpartum following a spontaneous vaginal delivery. I have fully reviewed the prenatal and intrapartum course. The delivery was at 40.0 gestational weeks.  Anesthesia: none. Postpartum course has been uneventful. Baby's course has been uneventful. Baby is feeding by breast. Bleeding: no bleeding. Bowel function is normal. Bladder function is normal. Patient is not sexually active. Contraception method is abstinence. Postpartum depression screening: negative.   Review of Systems Pertinent items are noted in HPI.   Objective:    There were no vitals taken for this visit.  General:  alert, cooperative and no distress   Breasts:  negative  Lungs: clear to auscultation bilaterally  Heart:  regular rate and rhythm  Abdomen: Soft, nontender   Vulva:  normal  Vagina: normal vagina  Cervix:  closed  Corpus: Well involuted     Rectal Exam: no hemorrhoids        Assessment:    normal postpartum exam.  Plan:    1. Contraception: condoms and vasectomy 2. Follow up in:  or as needed.

## 2014-06-19 ENCOUNTER — Encounter: Payer: Self-pay | Admitting: Advanced Practice Midwife

## 2015-09-05 ENCOUNTER — Other Ambulatory Visit (HOSPITAL_COMMUNITY): Payer: Self-pay | Admitting: Preventative Medicine

## 2015-09-05 DIAGNOSIS — M5416 Radiculopathy, lumbar region: Secondary | ICD-10-CM

## 2015-09-19 ENCOUNTER — Ambulatory Visit (HOSPITAL_COMMUNITY)
Admission: RE | Admit: 2015-09-19 | Discharge: 2015-09-19 | Disposition: A | Discharge status: Home / Self Care | Payer: BLUE CROSS/BLUE SHIELD | Source: Ambulatory Visit | Attending: Preventative Medicine | Admitting: Preventative Medicine

## 2015-09-19 DIAGNOSIS — M5126 Other intervertebral disc displacement, lumbar region: Secondary | ICD-10-CM | POA: Insufficient documentation

## 2015-09-19 DIAGNOSIS — M5127 Other intervertebral disc displacement, lumbosacral region: Secondary | ICD-10-CM | POA: Insufficient documentation

## 2015-09-19 DIAGNOSIS — R200 Anesthesia of skin: Secondary | ICD-10-CM | POA: Insufficient documentation

## 2015-09-19 DIAGNOSIS — M4806 Spinal stenosis, lumbar region: Secondary | ICD-10-CM | POA: Diagnosis not present

## 2015-09-19 DIAGNOSIS — M5416 Radiculopathy, lumbar region: Secondary | ICD-10-CM

## 2015-09-19 DIAGNOSIS — M545 Low back pain: Secondary | ICD-10-CM | POA: Diagnosis present

## 2018-01-20 ENCOUNTER — Other Ambulatory Visit: Payer: Self-pay | Admitting: Family Medicine

## 2018-01-20 DIAGNOSIS — R102 Pelvic and perineal pain: Secondary | ICD-10-CM

## 2018-01-20 DIAGNOSIS — R1031 Right lower quadrant pain: Secondary | ICD-10-CM

## 2018-01-26 ENCOUNTER — Ambulatory Visit
Admission: RE | Admit: 2018-01-26 | Discharge: 2018-01-26 | Disposition: A | Discharge status: Home / Self Care | Payer: BLUE CROSS/BLUE SHIELD | Source: Ambulatory Visit | Attending: Family Medicine | Admitting: Family Medicine

## 2018-01-26 DIAGNOSIS — R102 Pelvic and perineal pain: Secondary | ICD-10-CM

## 2018-01-26 DIAGNOSIS — R1031 Right lower quadrant pain: Secondary | ICD-10-CM

## 2018-02-03 ENCOUNTER — Other Ambulatory Visit: Payer: Self-pay

## 2018-02-03 DIAGNOSIS — N9489 Other specified conditions associated with female genital organs and menstrual cycle: Secondary | ICD-10-CM

## 2018-03-24 ENCOUNTER — Encounter

## 2018-03-24 ENCOUNTER — Encounter: Payer: BLUE CROSS/BLUE SHIELD | Admitting: Vascular Surgery

## 2018-03-24 ENCOUNTER — Ambulatory Visit (HOSPITAL_COMMUNITY): Payer: BLUE CROSS/BLUE SHIELD

## 2018-04-01 ENCOUNTER — Other Ambulatory Visit (HOSPITAL_COMMUNITY): Payer: Self-pay | Admitting: Physician Assistant

## 2018-04-01 DIAGNOSIS — M5441 Lumbago with sciatica, right side: Secondary | ICD-10-CM

## 2018-04-06 ENCOUNTER — Ambulatory Visit (HOSPITAL_COMMUNITY)
Admission: RE | Admit: 2018-04-06 | Discharge: 2018-04-06 | Disposition: A | Discharge status: Home / Self Care | Payer: BLUE CROSS/BLUE SHIELD | Source: Ambulatory Visit | Attending: Physician Assistant | Admitting: Physician Assistant

## 2018-04-06 DIAGNOSIS — M5441 Lumbago with sciatica, right side: Secondary | ICD-10-CM | POA: Insufficient documentation

## 2018-04-06 DIAGNOSIS — M5126 Other intervertebral disc displacement, lumbar region: Secondary | ICD-10-CM | POA: Diagnosis not present

## 2018-04-14 ENCOUNTER — Encounter: Payer: Self-pay | Admitting: Obstetrics and Gynecology

## 2018-05-10 ENCOUNTER — Ambulatory Visit: Payer: BLUE CROSS/BLUE SHIELD | Admitting: Obstetrics and Gynecology

## 2018-05-10 ENCOUNTER — Encounter: Payer: Self-pay | Admitting: Obstetrics and Gynecology

## 2018-05-10 VITALS — BP 124/75 | HR 100 | Ht 66.0 in | Wt 142.0 lb

## 2018-05-10 DIAGNOSIS — R1031 Right lower quadrant pain: Secondary | ICD-10-CM | POA: Diagnosis not present

## 2018-05-10 NOTE — Progress Notes (Signed)
Patient ID: Lonzo CloudKelly R Tsutsui, female   DOB: Mar 19, 1986, 32 y.o.   MRN: [id number]    [facility]  @DATE @            Patient name: Lonzo CloudKelly R Brisbane MRN [id number]  Date of birth: Mar 19, 1986  CC & HPI: Right lower quadrant abdominal discomfort, ultrasound with enlarged right uterine vein  Lonzo CloudKelly R Topper is a 32 y.o. female presenting today for pain on right side. It started in may and told PCP and thought was cyst. Pain was consistent and worsened with period.  U/s was ordered, [facility] and ultrasound obtained showed the right uterine vein was enlarged.  Menses have been normal in length and severity, is increasing in discomfort as she has aged  Had herniated disk at the time and had back surgery 3 weeks ago. After back surgery was taken care of pain is still there, but thinks she is getting used to the pain. Periods aren't any heavy overall, period length is 4-5 days, heavier flow first few days, constant, lighter towards the end.  Has had 2 births vaginally, no plans for more children, no birth control plans, husband was supposed to get vasectomy but has not yet.  The patient is hesitant to use BC again due to getting pregnant both times while on oral contraceptives and finding pills making her extremely hormonal. Denies any pain with intercourse.  Pain is dull today, before back surgery was taking multiple pain medication. Pain in persistent but not excruciating. Had pelvic exam earlier on in the year before pain started by her primary care physician  ROS:  ROS +right side pain primarily during her menses -fever -chills - dyspareunia.  All systems are negative except as noted in the HPI and PMH.   Pertinent History Reviewed:   Reviewed: Medical         Past Medical History:  Diagnosis Date  . HSP (Henoch-Schonlein purpura) nephritis                               Surgical Hx:    Past Surgical History:  Procedure  Laterality Date  . MOUTH SURGERY    . NO PAST SURGERIES     Medications: Reviewed & Updated - see associated section                       Current Outpatient Medications:  .  escitalopram (LEXAPRO) 5 MG tablet, , Disp: , Rfl: 2 .  gabapentin (NEURONTIN) 300 MG capsule, , Disp: , Rfl: 1 .  methocarbamol (ROBAXIN) 750 MG tablet, Take by mouth., Disp: , Rfl:  .  Prenatal Vit-Fe Fumarate-FA (PRENATAL MULTIVITAMIN) TABS, Take 1 tablet by mouth daily., Disp: , Rfl:    Social History: Reviewed -  reports that she has quit smoking. Her smoking use included cigarettes. She does not have any smokeless tobacco history on file.  Objective Findings:  Vitals: Blood pressure 124/75, pulse 100, height 5\' 6"  (1.676 m), weight 142 lb (64.4 kg), last menstrual period 04/18/2018, currently breastfeeding.  PHYSICAL EXAMINATION General appearance - alert, well appearing, and in no distress and oriented to person, place, and time Mental status - alert, oriented to person, place, and time, normal mood, behavior, speech, dress, motor activity, and thought processes, affect appropriate to mood  PELVIC External genitalia - normal Vagina - normal appearing Cervix - posterior,  tenderness Uterus - sidewall non-tender, adequate support left greater than right   Pelvic u/s : IMPRESSION: 1. Prominent pelvic vasculature raises the possibility of pelvic congestion syndrome. Recommend clinical correlation. No other abnormalities identified.   Electronically Signed   By: Gerome Sam III M.D   On: 01/26/2018 15:15   Pic 24/55 below:   Assessment & Plan:   A:  1.  Right pelvic pain with periods 2. Dysmenorrhea 3. Enlarged right uterine veins on u/s, what I believe to be an incidental finding and unrelated to her right lower quadrant pain  P:  1. Consider IUD to suppress menses 2. F/u 4 weeks with Dr. Despina Hidden who knows her from pregnancy care to obtain an in office second opinion prior to IUD  placement   By signing my name below, I, Arnette Norris, attest that this documentation has been prepared under the direction and in the presence of Tilda Burrow, MD. Electronically Signed: Arnette Norris Medical Scribe. 05/10/18. 3:12 PM.  I personally performed the services described in this documentation, which was SCRIBED in my presence. The recorded information has been reviewed and considered accurate. It has been edited as necessary during review. Tilda Burrow, MD

## 2018-05-11 DIAGNOSIS — R1031 Right lower quadrant pain: Secondary | ICD-10-CM | POA: Insufficient documentation

## 2018-06-08 ENCOUNTER — Ambulatory Visit: Payer: BLUE CROSS/BLUE SHIELD | Admitting: Obstetrics & Gynecology

## 2018-06-08 ENCOUNTER — Encounter: Payer: Self-pay | Admitting: Obstetrics & Gynecology

## 2018-06-08 VITALS — BP 120/74 | HR 83 | Ht 66.0 in | Wt 187.0 lb

## 2018-06-08 DIAGNOSIS — N946 Dysmenorrhea, unspecified: Secondary | ICD-10-CM | POA: Diagnosis not present

## 2018-06-08 DIAGNOSIS — R1031 Right lower quadrant pain: Secondary | ICD-10-CM

## 2018-06-08 NOTE — Progress Notes (Signed)
Patient ID: Natalie Foster, female   DOB: [birth year], 32 y.o.   MRN: [id number]      Chief Complaint  Patient presents with  . Follow-up      32 y.o. [id number] Patient's last menstrual period was 06/05/2018 (lmp unknown). The current method of family planning is none.  Outpatient Encounter Medications as of 06/08/2018  Medication Sig  . escitalopram (LEXAPRO) 5 MG tablet   . gabapentin (NEURONTIN) 300 MG capsule   . methocarbamol (ROBAXIN) 750 MG tablet Take by mouth.  . Prenatal Vit-Fe Fumarate-FA (PRENATAL MULTIVITAMIN) TABS Take 1 tablet by mouth daily.   No facility-administered encounter medications on file as of 06/08/2018.     Subjective Internal office referral for discussion regarding dysmenorrhea and BCM issues Reviewed her note with Dr Emelda Fear Told pt I do not subscribe to pelvic vein congestion as a cause of pelvic pain, it is a finding but not pathologic, the surrounding tissue shows no evidence of engorgement or other tissue changes which would indicate pain Sonogram is reviewed Discussed options for birth control and menstrual control Past Medical History:  Diagnosis Date  . HSP (Henoch-Schonlein purpura) nephritis (HCC)     Past Surgical History:  Procedure Laterality Date  . LAMINOTOMY    . LUMBAR DISC SURGERY    . MOUTH SURGERY    . NO PAST SURGERIES      OB History    Gravida  2   Para  2   Term  2   Preterm      AB      Living  2     SAB      TAB      Ectopic      Multiple      Live Births  1           Allergies  Allergen Reactions  . Cortisone Nausea And Vomiting  . Pineapple Hives and Itching    Social History   Socioeconomic History  . Marital status: Married    Spouse name: Not on file  . Number of children: Not on file  . Years of education: Not on file  . Highest education level: Not on file  Occupational History  . Not on file  Social Needs  . Financial resource strain: Not on file  . Food insecurity:      Worry: Not on file    Inability: Not on file  . Transportation needs:    Medical: Not on file    Non-medical: Not on file  Tobacco Use  . Smoking status: Former Smoker    Types: Cigarettes  . Smokeless tobacco: Never Used  Substance and Sexual Activity  . Alcohol use: No  . Drug use: No  . Sexual activity: Yes  Lifestyle  . Physical activity:    Days per week: Not on file    Minutes per session: Not on file  . Stress: Not on file  Relationships  . Social connections:    Talks on phone: Not on file    Gets together: Not on file    Attends religious service: Not on file    Active member of club or organization: Not on file    Attends meetings of clubs or organizations: Not on file    Relationship status: Not on file  Other Topics Concern  . Not on file  Social History Narrative  . Not on file    Family History  Problem Relation Age of Onset  . Cancer  Paternal Grandmother        lung  . Stroke Paternal Grandfather   . Cancer Paternal Grandfather        lung    Medications:       Current Outpatient Medications:  .  escitalopram (LEXAPRO) 5 MG tablet, , Disp: , Rfl: 2 .  gabapentin (NEURONTIN) 300 MG capsule, , Disp: , Rfl: 1 .  methocarbamol (ROBAXIN) 750 MG tablet, Take by mouth., Disp: , Rfl:  .  Prenatal Vit-Fe Fumarate-FA (PRENATAL MULTIVITAMIN) TABS, Take 1 tablet by mouth daily., Disp: , Rfl:   Objective Blood pressure 120/74, pulse 83, height 5\' 6"  (1.676 m), weight 187 lb (84.8 kg), last menstrual period 06/05/2018, currently breastfeeding.  Gen WDWN NAD  Pertinent ROS No burning with urination, frequency or urgency No nausea, vomiting or diarrhea Nor fever chills or other constitutional symptoms   Labs or studies Reviewed historical studies    Impression Diagnoses this Encounter::   ICD-10-CM   1. Dysmenorrhea N94.6   2. RLQ abdominal pain, widely intermittent R10.31     Established relevant diagnosis(es):   Plan/Recommendations: No  orders of the defined types were placed in this encounter.   Labs or Scans Ordered: No orders of the defined types were placed in this encounter.   Management:: >recommended laparoscopic bilateral salpingectomy for permanent BCM, if that is what she desires >consider endometrial ablation to address her worsening issue with dysmenorrhea >the laparoscopy would also allow eval of pelvis  Follow up Return if symptoms worsen or fail to improve, for pt to ruminate over options and send me a MyChart message with decision and questions.      Face to face time:  15 minutes  Greater than 50% of the visit time was spent in counseling and coordination of care with the patient.  The summary and outline of the counseling and care coordination is summarized in the note above.   All questions were answered.

## 2018-06-25 NOTE — Patient Instructions (Signed)
Natalie Foster  06/25/2018     @PREFPERIOPPHARMACY @   Your procedure is scheduled on  07/07/2018   Report to [facility] at  1000  A.M.  Call this number if you have problems the morning of surgery:  [phone]   Remember:  Do not eat or drink after midnight.                         Take these medicines the morning of surgery with A SIP OF WATER  Lexapro, gabapentin, methocarbamol.    Do not wear jewelry, make-up or nail polish.  Do not wear lotions, powders, or perfumes, or deodorant.  Do not shave 48 hours prior to surgery.  Men may shave face and neck.  Do not bring valuables to the hospital.  [facility] is not responsible for any belongings or valuables.  Contacts, dentures or bridgework may not be worn into surgery.  Leave your suitcase in the car.  After surgery it may be brought to your room.  For patients admitted to the hospital, discharge time will be determined by your treatment team.  Patients discharged the day of surgery will not be allowed to drive home.   Name and phone number of your driver:   family Special instructions:  None  Please read over the following fact sheets that you were given. Anesthesia Post-op Instructions and Care and Recovery After Surgery       Dilation and Curettage or Vacuum Curettage Dilation and curettage (D&C) and vacuum curettage are minor procedures. A D&C involves stretching (dilation) the cervix and scraping (curettage) the inside lining of the uterus (endometrium). During a D&C, tissue is gently scraped from the endometrium, starting from the top portion of the uterus down to the lowest part of the uterus (cervix). During a vacuum curettage, the lining and tissue in the uterus are removed with the use of gentle suction. Curettage may be performed to either diagnose or treat a problem. As a diagnostic procedure, curettage is performed to examine tissues from the uterus. A diagnostic curettage  may be done if you have:  Irregular bleeding in the uterus.  Bleeding with the development of clots.  Spotting between menstrual periods.  Prolonged menstrual periods or other abnormal bleeding.  Bleeding after menopause.  No menstrual period (amenorrhea).  A change in size and shape of the uterus.  Abnormal endometrial cells discovered during a Pap test.  As a treatment procedure, curettage may be performed for the following reasons:  Removal of an IUD (intrauterine device).  Removal of retained placenta after giving birth.  Abortion.  Miscarriage.  Removal of endometrial polyps.  Removal of uncommon types of noncancerous lumps (fibroids).  Tell a health care provider about:  Any allergies you have, including allergies to prescribed medicine or latex.  All medicines you are taking, including vitamins, herbs, eye drops, creams, and over-the-counter medicines. This is especially important if you take any blood-thinning medicine. Bring a list of all of your medicines to your appointment.  Any problems you or family members have had with anesthetic medicines.  Any blood disorders you have.  Any surgeries you have had.  Your medical history and any medical conditions you have.  Whether you are pregnant or may be pregnant.  Recent vaginal infections you have had.  Recent menstrual periods, bleeding problems you have had, and what form  of birth control (contraception) you use. What are the risks? Generally, this is a safe procedure. However, problems may occur, including:  Infection.  Heavy vaginal bleeding.  Allergic reactions to medicines.  Damage to the cervix or other structures or organs.  Development of scar tissue (adhesions) inside the uterus, which can cause abnormal amounts of menstrual bleeding. This may make it harder to get pregnant in the future.  A hole (perforation) or puncture in the uterine wall. This is rare.  What happens before the  procedure? Staying hydrated Follow instructions from your health care provider about hydration, which may include:  Up to 2 hours before the procedure - you may continue to drink clear liquids, such as water, clear fruit juice, black coffee, and plain tea.  Eating and drinking restrictions Follow instructions from your health care provider about eating and drinking, which may include:  8 hours before the procedure - stop eating heavy meals or foods such as meat, fried foods, or fatty foods.  6 hours before the procedure - stop eating light meals or foods, such as toast or cereal.  6 hours before the procedure - stop drinking milk or drinks that contain milk.  2 hours before the procedure - stop drinking clear liquids. If your health care provider told you to take your medicine(s) on the day of your procedure, take them with only a sip of water.  Medicines  Ask your health care provider about: ? Changing or stopping your regular medicines. This is especially important if you are taking diabetes medicines or blood thinners. ? Taking medicines such as aspirin and ibuprofen. These medicines can thin your blood. Do not take these medicines before your procedure if your health care provider instructs you not to.  You may be given antibiotic medicine to help prevent infection. General instructions  For 24 hours before your procedure, do not: ? Douche. ? Use tampons. ? Use medicines, creams, or suppositories in the vagina. ? Have sexual intercourse.  You may be given a pregnancy test on the day of the procedure.  Plan to have someone take you home from the hospital or clinic.  You may have a blood or urine sample taken.  If you will be going home right after the procedure, plan to have someone with you for 24 hours. What happens during the procedure?  To reduce your risk of infection: ? Your health care team will wash or sanitize their hands. ? Your skin will be washed with  soap.  An IV tube will be inserted into one of your veins.  You will be given one of the following: ? A medicine that numbs the area in and around the cervix (local anesthetic). ? A medicine to make you fall asleep (general anesthetic).  You will lie down on your back, with your feet in foot rests (stirrups).  The size and position of your uterus will be checked.  A lubricated instrument (speculum or Sims retractor) will be inserted into the back side of your vagina. The speculum will be used to hold apart the walls of your vagina so your health care provider can see your cervix.  A tool (tenaculum) will be attached to the lip of the cervix to stabilize it.  Your cervix will be softened and dilated. This may be done by: ? Taking a medicine. ? Having tapered dilators or thin rods (laminaria) or gradual widening instruments (tapered dilators) inserted into your cervix.  A small, sharp, curved instrument (curette) will be  used to scrape a small amount of tissue or cells from the endometrium or cervical canal. In some cases, gentle suction is applied with the curette. The curette will then be removed. The cells will be taken to a lab for testing. The procedure may vary among health care providers and hospitals. What happens after the procedure?  You may have mild cramping, backache, pain, and light bleeding or spotting. You may pass small blood clots from your vagina.  You may have to wear compression stockings. These stockings help to prevent blood clots and reduce swelling in your legs.  Your blood pressure, heart rate, breathing rate, and blood oxygen level will be monitored until the medicines you were given have worn off. Summary  Dilation and curettage (D&C) involves stretching (dilation) the cervix and scraping (curettage) the inside lining of the uterus (endometrium).  After the procedure, you may have mild cramping, backache, pain, and light bleeding or spotting. You may pass  small blood clots from your vagina.  Plan to have someone take you home from the hospital or clinic. This information is not intended to replace advice given to you by your health care provider. Make sure you discuss any questions you have with your health care provider. Document Released: 08/04/2005 Document Revised: 04/20/2016 Document Reviewed: 04/20/2016 Elsevier Interactive Patient Education  2018 [employer].  Dilation and Curettage or Vacuum Curettage, Care After These instructions give you information about caring for yourself after your procedure. Your doctor may also give you more specific instructions. Call your doctor if you have any problems or questions after your procedure. Follow these instructions at home: Activity  Do not drive or use heavy machinery while taking prescription pain medicine.  For 24 hours after your procedure, avoid driving.  Take short walks often, followed by rest periods. Ask your doctor what activities are safe for you. After one or two days, you may be able to return to your normal activities.  Do not lift anything that is heavier than 10 lb (4.5 kg) until your doctor approves.  For at least 2 weeks, or as long as told by your doctor: ? Do not douche. ? Do not use tampons. ? Do not have sex. General instructions  Take over-the-counter and prescription medicines only as told by your doctor. This is very important if you take blood thinning medicine.  Do not take baths, swim, or use a hot tub until your doctor approves. Take showers instead of baths.  Wear compression stockings as told by your doctor.  It is up to you to get the results of your procedure. Ask your doctor when your results will be ready.  Keep all follow-up visits as told by your doctor. This is important. Contact a doctor if:  You have very bad cramps that get worse or do not get better with medicine.  You have very bad pain in your belly (abdomen).  You cannot drink  fluids without throwing up (vomiting).  You get pain in a different part of the area between your belly and thighs (pelvis).  You have bad-smelling discharge from your vagina.  You have a rash. Get help right away if:  You are bleeding a lot from your vagina. A lot of bleeding means soaking more than one sanitary pad in an hour, for 2 hours in a row.  You have clumps of blood (blood clots) coming from your vagina.  You have a fever or chills.  Your belly feels very tender or hard.  You  have chest pain.  You have trouble breathing.  You cough up blood.  You feel dizzy.  You feel light-headed.  You pass out (faint).  You have pain in your neck or shoulder area. Summary  Take short walks often, followed by rest periods. Ask your doctor what activities are safe for you. After one or two days, you may be able to return to your normal activities.  Do not lift anything that is heavier than 10 lb (4.5 kg) until your doctor approves.  Do not take baths, swim, or use a hot tub until your doctor approves. Take showers instead of baths.  Contact your doctor if you have any symptoms of infection, like bad-smelling discharge from your vagina. This information is not intended to replace advice given to you by your health care provider. Make sure you discuss any questions you have with your health care provider. Document Released: 05/13/2008 Document Revised: 04/21/2016 Document Reviewed: 04/21/2016 Elsevier Interactive Patient Education  2017 [employer] Hysteroscopy Hysteroscopy is a procedure used for looking inside the womb (uterus). It may be done for various reasons, including:  To evaluate abnormal bleeding, fibroid (benign, noncancerous) tumors, polyps, scar tissue (adhesions), and possibly cancer of the uterus.  To look for lumps (tumors) and other uterine growths.  To look for causes of why a woman cannot get pregnant (infertility), causes of recurrent loss of pregnancy  (miscarriages), or a lost intrauterine device (IUD).  To perform a sterilization by blocking the fallopian tubes from inside the uterus.  In this procedure, a thin, flexible tube with a tiny light and camera on the end of it (hysteroscope) is used to look inside the uterus. A hysteroscopy should be done right after a menstrual period to be sure you are not pregnant. LET [facility] CARE PROVIDER KNOW ABOUT:  Any allergies you have.  All medicines you are taking, including vitamins, herbs, eye drops, creams, and over-the-counter medicines.  Previous problems you or members of your family have had with the use of anesthetics.  Any blood disorders you have.  Previous surgeries you have had.  Medical conditions you have. RISKS AND COMPLICATIONS Generally, this is a safe procedure. However, as with any procedure, complications can occur. Possible complications include:  Putting a hole in the uterus.  Excessive bleeding.  Infection.  Damage to the cervix.  Injury to other organs.  Allergic reaction to medicines.  Too much fluid used in the uterus for the procedure.  BEFORE THE PROCEDURE  Ask your health care provider about changing or stopping any regular medicines.  Do not take aspirin or blood thinners for 1 week before the procedure, or as directed by your health care provider. These can cause bleeding.  If you smoke, do not smoke for 2 weeks before the procedure.  In some cases, a medicine is placed in the cervix the day before the procedure. This medicine makes the cervix have a larger opening (dilate). This makes it easier for the instrument to be inserted into the uterus during the procedure.  Do not eat or drink anything for at least 8 hours before the surgery.  Arrange for someone to take you home after the procedure. PROCEDURE  You may be given a medicine to relax you (sedative). You may also be given one of the following: ? A medicine that numbs the area around  the cervix (local anesthetic). ? A medicine that makes you sleep through the procedure (general anesthetic).  The hysteroscope is inserted through the vagina  into the uterus. The camera on the hysteroscope sends a picture to a TV screen. This gives the surgeon a good view inside the uterus.  During the procedure, air or a liquid is put into the uterus, which allows the surgeon to see better.  Sometimes, tissue is gently scraped from inside the uterus. These tissue samples are sent to a lab for testing. What to expect after the procedure  If you had a general anesthetic, you may be groggy for a couple hours after the procedure.  If you had a local anesthetic, you will be able to go home as soon as you are stable and feel ready.  You may have some cramping. This normally lasts for a couple days.  You may have bleeding, which varies from light spotting for a few days to menstrual-like bleeding for 3-7 days. This is normal.  If your test results are not back during the visit, make an appointment with your health care provider to find out the results. This information is not intended to replace advice given to you by your health care provider. Make sure you discuss any questions you have with your health care provider. Document Released: 11/10/2000 Document Revised: 01/10/2016 Document Reviewed: 03/03/2013 Elsevier Interactive Patient Education  2017 [employer]. Hysteroscopy, Care After Refer to this sheet in the next few weeks. These instructions provide you with information on caring for yourself after your procedure. Your health care provider may also give you more specific instructions. Your treatment has been planned according to current medical practices, but problems sometimes occur. Call your health care provider if you have any problems or questions after your procedure. What can I expect after the procedure? After your procedure, it is typical to have the following:  You may have  some cramping. This normally lasts for a couple days.  You may have bleeding. This can vary from light spotting for a few days to menstrual-like bleeding for 3-7 days.  Follow these instructions at home:  Rest for the first 1-2 days after the procedure.  Only take over-the-counter or prescription medicines as directed by your health care provider. Do not take aspirin. It can increase the chances of bleeding.  Take showers instead of baths for 2 weeks or as directed by your health care provider.  Do not drive for 24 hours or as directed.  Do not drink alcohol while taking pain medicine.  Do not use tampons, douche, or have sexual intercourse for 2 weeks or until your health care provider says it is okay.  Take your temperature twice a day for 4-5 days. Write it down each time.  Follow your health care provider's advice about diet, exercise, and lifting.  If you develop constipation, you may: ? Take a mild laxative if your health care provider approves. ? Add bran foods to your diet. ? Drink enough fluids to keep your urine clear or pale yellow.  Try to have someone with you or available to you for the first 24-48 hours, especially if you were given a general anesthetic.  Follow up with your health care provider as directed. Contact a health care provider if:  You feel dizzy or lightheaded.  You feel sick to your stomach (nauseous).  You have abnormal vaginal discharge.  You have a rash.  You have pain that is not controlled with medicine. Get help right away if:  You have bleeding that is heavier than a normal menstrual period.  You have a fever.  You have increasing  cramps or pain, not controlled with medicine.  You have new belly (abdominal) pain.  You pass out.  You have pain in the tops of your shoulders (shoulder strap areas).  You have shortness of breath. This information is not intended to replace advice given to you by your health care provider. Make  sure you discuss any questions you have with your health care provider. Document Released: 05/25/2013 Document Revised: 01/10/2016 Document Reviewed: 03/03/2013 Elsevier Interactive Patient Education  2017 [employer]  Salpingectomy Salpingectomy, also called tubectomy, is the surgical removal of one of the fallopian tubes. The fallopian tubes are where eggs travel from the ovaries to the uterus. Removing one fallopian tube does not prevent you from becoming pregnant. It also does not cause problems with your menstrual periods. You may need a salpingectomy if you:  Have a fertilized egg that attaches to the fallopian tube (ectopic pregnancy), especially one that causes the tube to burst or tear (rupture).  Have an infected fallopian tube.  Have cancer of the fallopian tube or nearby organs.  Have had an ovary removed due to a cyst or tumor.  Have had your uterus removed.  There are three different methods that can be used for a salpingectomy:  Open. This method involves making one large incision in your abdomen.  Laparoscopic. This method involves using a thin, lighted tube with a tiny camera on the end (laparoscope) to help perform the procedure. The laparoscope will allow your surgeon to make several small incisions in the abdomen instead of a large incision.  Robot-assisted: This method involves using a computer to control surgical instruments that are attached to robotic arms.  Tell a health care provider about:  Any allergies you have.  All medicines you are taking, including vitamins, herbs, eye drops, creams, and over-the-counter medicines.  Any problems you or family members have had with anesthetic medicines.  Any blood disorders you have.  Any surgeries you have had.  Any medical conditions you have.  Whether you are pregnant or may be pregnant. What are the risks? Generally, this is a safe procedure. However, problems may occur,  including:  Infection.  Bleeding.  Allergic reactions to medicines.  Damage to other structures or organs.  Blood clots in the legs or lungs.  What happens before the procedure? Staying hydrated Follow instructions from your health care provider about hydration, which may include:  Up to 2 hours before the procedure - you may continue to drink clear liquids, such as water, clear fruit juice, black coffee, and plain tea.  Eating and drinking restrictions Follow instructions from your health care provider about eating and drinking, which may include:  8 hours before the procedure - stop eating heavy meals or foods such as meat, fried foods, or fatty foods.  6 hours before the procedure - stop eating light meals or foods, such as toast or cereal.  6 hours before the procedure - stop drinking milk or drinks that contain milk.  2 hours before the procedure - stop drinking clear liquids.  Medicines  Ask your health care provider about: ? Changing or stopping your regular medicines. This is especially important if you are taking diabetes medicines or blood thinners. ? Taking medicines such as aspirin and ibuprofen. These medicines can thin your blood. Do not take these medicines before your procedure if your health care provider instructs you not to.  You may be given antibiotic medicine to help prevent infection. General instructions  Do not smoke for at  least 2 weeks before your procedure. If you need help quitting, ask your health care provider.  You may have an exam or tests, such as an electrocardiogram (ECG).  You may have a blood or urine sample taken.  Ask your health care provider: ? Whether you should stop removing hair from your surgical area. ? How your surgical site will be marked or identified.  You may be asked to shower with a germ-killing soap.  Plan to have someone take you home from the hospital or clinic.  If you will be going home right after the  procedure, plan to have someone with you for 24 hours. What happens during the procedure?  To reduce your risk of infection: ? Your health care team will wash or sanitize their hands. ? Hair may be removed from the surgical area. ? Your skin will be washed with soap.  An IV tube will be inserted into one of your veins.  You will be given a medicine to make you fall asleep (general anesthetic). You may also be given a medicine to help you relax (sedative).  A thin tube (catheter) may be inserted through your urethra and into your bladder to drain urine during your procedure.  Depending on the type of procedure you are having, one incision or several small incisions will be made in your abdomen.  Your fallopian tube will be cut and removed from where it attaches to your uterus.  Your blood vessels will be clamped and tied to prevent excess bleeding.  The incision(s) in your abdomen will be closed with stitches (sutures), staples, or skin glue.  A bandage (dressing) may be placed over your incision(s). The procedure may vary among health care providers and hospitals. What happens after the procedure?  Your blood pressure, heart rate, breathing rate, and blood oxygen level will be monitored until the medicines you were given have worn off.  You may continue to receive fluids and medicines through an IV tube.  You may continue to have a catheter draining your urine.  You may have to wear compression stockings. These stockings help to prevent blood clots and reduce swelling in your legs.  You will be given pain medicine as needed.  Do not drive for 24 hours if you received a sedative. Summary  Salpingectomy is a surgical procedure to remove one of the fallopian tubes.  The procedure may be done with an open incision, with a laparoscope, or with computer-controlled instruments.  Depending on the type of procedure you are having, one incision or several small incisions will be made  in your abdomen.  Your blood pressure, heart rate, breathing rate, and blood oxygen level will be monitored until the medicines you were given have worn off.  Plan to have someone take you home from the hospital or clinic. This information is not intended to replace advice given to you by your health care provider. Make sure you discuss any questions you have with your health care provider. Document Released: 12/21/2008 Document Revised: 03/21/2016 Document Reviewed: 01/26/2013 Elsevier Interactive Patient Education  2018 [employer]  Endometrial Ablation Endometrial ablation is a procedure that destroys the thin inner layer of the lining of the uterus (endometrium). This procedure may be done:  To stop heavy periods.  To stop bleeding that is causing anemia.  To control irregular bleeding.  To treat bleeding caused by small tumors (fibroids) in the endometrium.  This procedure is often an alternative to major surgery, such as removal of the  uterus and cervix (hysterectomy). As a result of this procedure:  You may not be able to have children. However, if you are premenopausal (you have not gone through menopause): ? You may still have a small chance of getting pregnant. ? You will need to use a reliable method of birth control after the procedure to prevent pregnancy.  You may stop having a menstrual period, or you may have only a small amount of bleeding during your period. Menstruation may return several years after the procedure.  Tell a health care provider about:  Any allergies you have.  All medicines you are taking, including vitamins, herbs, eye drops, creams, and over-the-counter medicines.  Any problems you or family members have had with the use of anesthetic medicines.  Any blood disorders you have.  Any surgeries you have had.  Any medical conditions you have. What are the risks? Generally, this is a safe procedure. However, problems may occur,  including:  A hole (perforation) in the uterus or bowel.  Infection of the uterus, bladder, or vagina.  Bleeding.  Damage to other structures or organs.  An air bubble in the lung (air embolus).  Problems with pregnancy after the procedure.  Failure of the procedure.  Decreased ability to diagnose cancer in the endometrium.  What happens before the procedure?  You will have tests of your endometrium to make sure there are no pre-cancerous cells or cancer cells present.  You may have an ultrasound of the uterus.  You may be given medicines to thin the endometrium.  Ask your health care provider about: ? Changing or stopping your regular medicines. This is especially important if you take diabetes medicines or blood thinners. ? Taking medicines such as aspirin and ibuprofen. These medicines can thin your blood. Do not take these medicines before your procedure if your doctor tells you not to.  Plan to have someone take you home from the hospital or clinic. What happens during the procedure?  You will lie on an exam table with your feet and legs supported as in a pelvic exam.  To lower your risk of infection: ? Your health care team will wash or sanitize their hands and put on germ-free (sterile) gloves. ? Your genital area will be washed with soap.  An IV tube will be inserted into one of your veins.  You will be given a medicine to help you relax (sedative).  A surgical instrument with a light and camera (resectoscope) will be inserted into your vagina and moved into your uterus. This allows your surgeon to see inside your uterus.  Endometrial tissue will be removed using one of the following methods: ? Radiofrequency. This method uses a radiofrequency-alternating electric current to remove the endometrium. ? Cryotherapy. This method uses extreme cold to freeze the endometrium. ? Heated-free liquid. This method uses a heated saltwater (saline) solution to remove the  endometrium. ? Microwave. This method uses high-energy microwaves to heat up the endometrium and remove it. ? Thermal balloon. This method involves inserting a catheter with a balloon tip into the uterus. The balloon tip is filled with heated fluid to remove the endometrium. The procedure may vary among health care providers and hospitals. What happens after the procedure?  Your blood pressure, heart rate, breathing rate, and blood oxygen level will be monitored until the medicines you were given have worn off.  As tissue healing occurs, you may notice vaginal bleeding for 4-6 weeks after the procedure. You may also experience: ? Cramps. ?  Thin, watery vaginal discharge that is light pink or brown in color. ? A need to urinate more frequently than usual. ? Nausea.  Do not drive for 24 hours if you were given a sedative.  Do not have sex or insert anything into your vagina until your health care provider approves. Summary  Endometrial ablation is done to treat the many causes of heavy menstrual bleeding.  The procedure may be done only after medications have been tried to control the bleeding.  Plan to have someone take you home from the hospital or clinic. This information is not intended to replace advice given to you by your health care provider. Make sure you discuss any questions you have with your health care provider. Document Released: 06/13/2004 Document Revised: 08/21/2016 Document Reviewed: 08/21/2016 Elsevier Interactive Patient Education  2017 [employer]  General Anesthesia, Adult General anesthesia is the use of medicines to make a person "go to sleep" (be unconscious) for a medical procedure. General anesthesia is often recommended when a procedure:  Is long.  Requires you to be still or in an unusual position.  Is major and can cause you to lose blood.  Is impossible to do without general anesthesia.  The medicines used for general anesthesia are called  general anesthetics. In addition to making you sleep, the medicines:  Prevent pain.  Control your blood pressure.  Relax your muscles.  Tell a health care provider about:  Any allergies you have.  All medicines you are taking, including vitamins, herbs, eye drops, creams, and over-the-counter medicines.  Any problems you or family members have had with anesthetic medicines.  Types of anesthetics you have had in the past.  Any bleeding disorders you have.  Any surgeries you have had.  Any medical conditions you have.  Any history of heart or lung conditions, such as heart failure, sleep apnea, or chronic obstructive pulmonary disease (COPD).  Whether you are pregnant or may be pregnant.  Whether you use tobacco, alcohol, marijuana, or street drugs.  Any history of [employer].  Any history of depression or anxiety. What are the risks? Generally, this is a safe procedure. However, problems may occur, including:  Allergic reaction to anesthetics.  Lung and heart problems.  Inhaling food or liquids from your stomach into your lungs (aspiration).  Injury to nerves.  Waking up during your procedure and being unable to move (rare).  Extreme agitation or a state of mental confusion (delirium) when you wake up from the anesthetic.  Air in the bloodstream, which can lead to stroke.  These problems are more likely to develop if you are having a major surgery or if you have an advanced medical condition. You can prevent some of these complications by answering all of your health care provider's questions thoroughly and by following all pre-procedure instructions. General anesthesia can cause side effects, including:  Nausea or vomiting  A sore throat from the breathing tube.  Feeling cold or shivery.  Feeling tired, washed out, or achy.  Sleepiness or drowsiness.  Confusion or agitation.  What happens before the procedure? Staying hydrated Follow  instructions from your health care provider about hydration, which may include:  Up to 2 hours before the procedure - you may continue to drink clear liquids, such as water, clear fruit juice, black coffee, and plain tea.  Eating and drinking restrictions Follow instructions from your health care provider about eating and drinking, which may include:  8 hours before the procedure - stop eating heavy  meals or foods such as meat, fried foods, or fatty foods.  6 hours before the procedure - stop eating light meals or foods, such as toast or cereal.  6 hours before the procedure - stop drinking milk or drinks that contain milk.  2 hours before the procedure - stop drinking clear liquids.  Medicines  Ask your health care provider about: ? Changing or stopping your regular medicines. This is especially important if you are taking diabetes medicines or blood thinners. ? Taking medicines such as aspirin and ibuprofen. These medicines can thin your blood. Do not take these medicines before your procedure if your health care provider instructs you not to. ? Taking new dietary supplements or medicines. Do not take these during the week before your procedure unless your health care provider approves them.  If you are told to take a medicine or to continue taking a medicine on the day of the procedure, take the medicine with sips of water. General instructions   Ask if you will be going home the same day, the following day, or after a longer hospital stay. ? Plan to have someone take you home. ? Plan to have someone stay with you for the first 24 hours after you leave the hospital or clinic.  For 3-6 weeks before the procedure, try not to use any tobacco products, such as cigarettes, chewing tobacco, and e-cigarettes.  You may brush your teeth on the morning of the procedure, but make sure to spit out the toothpaste. What happens during the procedure?  You will be given anesthetics through a  mask and through an IV tube in one of your veins.  You may receive medicine to help you relax (sedative).  As soon as you are asleep, a breathing tube may be used to help you breathe.  An anesthesia specialist will stay with you throughout the procedure. He or she will help keep you comfortable and safe by continuing to give you medicines and adjusting the amount of medicine that you get. He or she will also watch your blood pressure, pulse, and oxygen levels to make sure that the anesthetics do not cause any problems.  If a breathing tube was used to help you breathe, it will be removed before you wake up. The procedure may vary among health care providers and hospitals. What happens after the procedure?  You will wake up, often slowly, after the procedure is complete, usually in a recovery area.  Your blood pressure, heart rate, breathing rate, and blood oxygen level will be monitored until the medicines you were given have worn off.  You may be given medicine to help you calm down if you feel anxious or agitated.  If you will be going home the same day, your health care provider may check to make sure you can stand, drink, and urinate.  Your health care providers will treat your pain and side effects before you go home.  Do not drive for 24 hours if you received a sedative.  You may: ? Feel nauseous and vomit. ? Have a sore throat. ? Have mental slowness. ? Feel cold or shivery. ? Feel sleepy. ? Feel tired. ? Feel sore or achy, even in parts of your body where you did not have surgery. This information is not intended to replace advice given to you by your health care provider. Make sure you discuss any questions you have with your health care provider. Document Released: 11/11/2007 Document Revised: 01/15/2016 Document Reviewed: 07/19/2015 Elsevier  Interactive Patient Education  2018 [employer] General Anesthesia, Adult, Care After These instructions provide you with  information about caring for yourself after your procedure. Your health care provider may also give you more specific instructions. Your treatment has been planned according to current medical practices, but problems sometimes occur. Call your health care provider if you have any problems or questions after your procedure. What can I expect after the procedure? After the procedure, it is common to have:  Vomiting.  A sore throat.  Mental slowness.  It is common to feel:  Nauseous.  Cold or shivery.  Sleepy.  Tired.  Sore or achy, even in parts of your body where you did not have surgery.  Follow these instructions at home: For at least 24 hours after the procedure:  Do not: ? Participate in activities where you could fall or become injured. ? Drive. ? Use heavy machinery. ? Drink alcohol. ? Take sleeping pills or medicines that cause drowsiness. ? Make important decisions or sign legal documents. ? Take care of children on your own.  Rest. Eating and drinking  If you vomit, drink water, juice, or soup when you can drink without vomiting.  Drink enough fluid to keep your urine clear or pale yellow.  Make sure you have little or no nausea before eating solid foods.  Follow the diet recommended by your health care provider. General instructions  Have a responsible adult stay with you until you are awake and alert.  Return to your normal activities as told by your health care provider. Ask your health care provider what activities are safe for you.  Take over-the-counter and prescription medicines only as told by your health care provider.  If you smoke, do not smoke without supervision.  Keep all follow-up visits as told by your health care provider. This is important. Contact a health care provider if:  You continue to have nausea or vomiting at home, and medicines are not helpful.  You cannot drink fluids or start eating again.  You cannot urinate after  8-12 hours.  You develop a skin rash.  You have fever.  You have increasing redness at the site of your procedure. Get help right away if:  You have difficulty breathing.  You have chest pain.  You have unexpected bleeding.  You feel that you are having a life-threatening or urgent problem. This information is not intended to replace advice given to you by your health care provider. Make sure you discuss any questions you have with your health care provider. Document Released: 11/10/2000 Document Revised: 01/07/2016 Document Reviewed: 07/19/2015 Elsevier Interactive Patient Education  [employer].

## 2018-07-01 ENCOUNTER — Other Ambulatory Visit: Payer: Self-pay | Admitting: Obstetrics & Gynecology

## 2018-07-01 ENCOUNTER — Encounter (HOSPITAL_COMMUNITY)
Admission: RE | Admit: 2018-07-01 | Discharge: 2018-07-01 | Disposition: A | Discharge status: Home / Self Care | Payer: BLUE CROSS/BLUE SHIELD | Source: Ambulatory Visit | Attending: Obstetrics & Gynecology | Admitting: Obstetrics & Gynecology

## 2018-07-01 ENCOUNTER — Other Ambulatory Visit: Payer: Self-pay

## 2018-07-01 ENCOUNTER — Encounter (HOSPITAL_COMMUNITY): Payer: Self-pay

## 2018-07-01 DIAGNOSIS — N946 Dysmenorrhea, unspecified: Secondary | ICD-10-CM | POA: Diagnosis not present

## 2018-07-01 DIAGNOSIS — Z01818 Encounter for other preprocedural examination: Secondary | ICD-10-CM | POA: Diagnosis present

## 2018-07-01 DIAGNOSIS — R1031 Right lower quadrant pain: Secondary | ICD-10-CM | POA: Insufficient documentation

## 2018-07-01 LAB — COMPREHENSIVE METABOLIC PANEL
ALT: 22 U/L (ref 0–44)
AST: 20 U/L (ref 15–41)
Albumin: 4 g/dL (ref 3.5–5.0)
Alkaline Phosphatase: 58 U/L (ref 38–126)
Anion gap: 6 (ref 5–15)
BUN: 12 mg/dL (ref 6–20)
CO2: 25 mmol/L (ref 22–32)
Calcium: 9 mg/dL (ref 8.9–10.3)
Chloride: 106 mmol/L (ref 98–111)
Creatinine, Ser: 0.52 mg/dL (ref 0.44–1.00)
GFR calc Af Amer: >60 mL/min (ref >60)
GFR calc non Af Amer: >60 mL/min (ref >60)
Glucose, Bld: 84 mg/dL (ref 70–99)
Potassium: 3.6 mmol/L (ref 3.5–5.1)
Sodium: 137 mmol/L (ref 135–145)
Total Bilirubin: 0.6 mg/dL (ref 0.3–1.2)
Total Protein: 7.5 g/dL (ref 6.5–8.1)

## 2018-07-01 LAB — RAPID HIV SCREEN (HIV 1/2 AB+AG)
HIV 1/2 Antibodies: NONREACTIVE
HIV-1 P24 Antigen - HIV24: NONREACTIVE

## 2018-07-01 LAB — URINALYSIS, ROUTINE W REFLEX MICROSCOPIC
Bilirubin Urine: NEGATIVE
Glucose, UA: NEGATIVE mg/dL
Ketones, ur: NEGATIVE mg/dL
Nitrite: NEGATIVE
Protein, ur: NEGATIVE mg/dL
Specific Gravity, Urine: 1.025 (ref 1.005–1.030)
pH: 6 (ref 5.0–8.0)

## 2018-07-01 LAB — HCG, QUANTITATIVE, PREGNANCY: hCG, Beta Chain, Quant, S: <1 m[IU]/mL (ref <5)

## 2018-07-01 LAB — CBC
HCT: 36.8 % (ref 36.0–46.0)
Hemoglobin: 11.6 g/dL — ABNORMAL LOW (ref 12.0–15.0)
MCH: 28.4 pg (ref 26.0–34.0)
MCHC: 31.5 g/dL (ref 30.0–36.0)
MCV: 90.2 fL (ref 80.0–100.0)
Platelets: 299 10*3/uL (ref 150–400)
RBC: 4.08 MIL/uL (ref 3.87–5.11)
RDW: 12.4 % (ref 11.5–15.5)
WBC: 4.8 10*3/uL (ref 4.0–10.5)
nRBC: 0 % (ref 0.0–0.2)

## 2018-07-02 ENCOUNTER — Other Ambulatory Visit (HOSPITAL_COMMUNITY): Payer: BLUE CROSS/BLUE SHIELD

## 2018-07-07 ENCOUNTER — Ambulatory Visit (HOSPITAL_COMMUNITY): Payer: BLUE CROSS/BLUE SHIELD | Admitting: Anesthesiology

## 2018-07-07 ENCOUNTER — Encounter (HOSPITAL_COMMUNITY)
Admission: RE | Disposition: A | Discharge status: Home / Self Care | Payer: Self-pay | Source: Ambulatory Visit | Attending: Obstetrics & Gynecology

## 2018-07-07 ENCOUNTER — Ambulatory Visit (HOSPITAL_COMMUNITY)
Admission: RE | Admit: 2018-07-07 | Discharge: 2018-07-07 | Disposition: A | Discharge status: Home / Self Care | Payer: BLUE CROSS/BLUE SHIELD | Source: Ambulatory Visit | Attending: Obstetrics & Gynecology | Admitting: Obstetrics & Gynecology

## 2018-07-07 ENCOUNTER — Encounter (HOSPITAL_COMMUNITY): Payer: Self-pay | Admitting: Anesthesiology

## 2018-07-07 DIAGNOSIS — N946 Dysmenorrhea, unspecified: Secondary | ICD-10-CM | POA: Insufficient documentation

## 2018-07-07 DIAGNOSIS — D690 Allergic purpura: Secondary | ICD-10-CM | POA: Insufficient documentation

## 2018-07-07 DIAGNOSIS — Z87891 Personal history of nicotine dependence: Secondary | ICD-10-CM | POA: Diagnosis not present

## 2018-07-07 DIAGNOSIS — Z302 Encounter for sterilization: Secondary | ICD-10-CM | POA: Diagnosis not present

## 2018-07-07 DIAGNOSIS — Z4002 Encounter for prophylactic removal of ovary: Secondary | ICD-10-CM | POA: Diagnosis not present

## 2018-07-07 HISTORY — PX: LAPAROSCOPIC BILATERAL SALPINGECTOMY: SHX5889

## 2018-07-07 SURGERY — SALPINGECTOMY, BILATERAL, LAPAROSCOPIC
Anesthesia: General | Site: Vagina

## 2018-07-07 MED ORDER — CEFAZOLIN SODIUM-DEXTROSE 2-4 GM/100ML-% IV SOLN
INTRAVENOUS | Status: AC
  Filled 2018-07-07: qty 100

## 2018-07-07 MED ORDER — KETOROLAC TROMETHAMINE 30 MG/ML IJ SOLN
30.0000 mg | Freq: Once | INTRAMUSCULAR | Status: AC
  Administered 2018-07-07: 30 mg via INTRAVENOUS

## 2018-07-07 MED ORDER — ROCURONIUM BROMIDE 100 MG/10ML IV SOLN
INTRAVENOUS | Status: DC | PRN
  Administered 2018-07-07: 40 mg via INTRAVENOUS

## 2018-07-07 MED ORDER — BUPIVACAINE LIPOSOME 1.3 % IJ SUSP
INTRAMUSCULAR | Status: DC | PRN
  Administered 2018-07-07: 20 mL

## 2018-07-07 MED ORDER — KETOROLAC TROMETHAMINE 10 MG PO TABS: 10.0000 mg | ORAL_TABLET | Freq: Three times a day (TID) | ORAL | 0 refills | Status: DC | PRN

## 2018-07-07 MED ORDER — KETOROLAC TROMETHAMINE 30 MG/ML IJ SOLN
INTRAMUSCULAR | Status: AC
  Filled 2018-07-07: qty 1

## 2018-07-07 MED ORDER — HYDROMORPHONE HCL 1 MG/ML IJ SOLN
INTRAMUSCULAR | Status: AC
  Filled 2018-07-07: qty 0.5

## 2018-07-07 MED ORDER — ONDANSETRON HCL 4 MG/2ML IJ SOLN
INTRAMUSCULAR | Status: DC | PRN
  Administered 2018-07-07: 4 mg via INTRAVENOUS

## 2018-07-07 MED ORDER — DEXAMETHASONE SODIUM PHOSPHATE 10 MG/ML IJ SOLN
INTRAMUSCULAR | Status: AC
  Filled 2018-07-07: qty 1

## 2018-07-07 MED ORDER — BUPIVACAINE LIPOSOME 1.3 % IJ SUSP
20.0000 mL | Freq: Once | INTRAMUSCULAR | Status: DC
  Filled 2018-07-07: qty 20

## 2018-07-07 MED ORDER — LIDOCAINE HCL 1 % IJ SOLN
INTRAMUSCULAR | Status: DC | PRN
  Administered 2018-07-07: 40 mg via INTRADERMAL

## 2018-07-07 MED ORDER — 0.9 % SODIUM CHLORIDE (POUR BTL) OPTIME
TOPICAL | Status: DC | PRN
  Administered 2018-07-07: 1000 mL

## 2018-07-07 MED ORDER — PROPOFOL 10 MG/ML IV BOLUS
INTRAVENOUS | Status: DC | PRN
  Administered 2018-07-07: 150 mg via INTRAVENOUS

## 2018-07-07 MED ORDER — MIDAZOLAM HCL 2 MG/2ML IJ SOLN: 0.5000 mg | Freq: Once | INTRAMUSCULAR | Status: DC | PRN

## 2018-07-07 MED ORDER — ONDANSETRON HCL 4 MG/2ML IJ SOLN
INTRAMUSCULAR | Status: AC
  Filled 2018-07-07: qty 2

## 2018-07-07 MED ORDER — FENTANYL CITRATE (PF) 100 MCG/2ML IJ SOLN
INTRAMUSCULAR | Status: DC | PRN
  Administered 2018-07-07 (×2): 50 ug via INTRAVENOUS
  Administered 2018-07-07: 100 ug via INTRAVENOUS

## 2018-07-07 MED ORDER — MIDAZOLAM HCL 5 MG/5ML IJ SOLN
INTRAMUSCULAR | Status: DC | PRN
  Administered 2018-07-07: 2 mg via INTRAVENOUS

## 2018-07-07 MED ORDER — PROMETHAZINE HCL 25 MG/ML IJ SOLN: 6.2500 mg | INTRAMUSCULAR | Status: DC | PRN

## 2018-07-07 MED ORDER — SUGAMMADEX SODIUM 200 MG/2ML IV SOLN
INTRAVENOUS | Status: DC | PRN
  Administered 2018-07-07: 171.4 mg via INTRAVENOUS

## 2018-07-07 MED ORDER — HYDROMORPHONE HCL 1 MG/ML IJ SOLN
0.2500 mg | INTRAMUSCULAR | Status: DC | PRN
  Administered 2018-07-07 (×4): 0.5 mg via INTRAVENOUS
  Filled 2018-07-07 (×3): qty 0.5

## 2018-07-07 MED ORDER — HYDROCODONE-ACETAMINOPHEN 5-325 MG PO TABS: 1.0000 | ORAL_TABLET | Freq: Four times a day (QID) | ORAL | 0 refills | Status: DC | PRN

## 2018-07-07 MED ORDER — HYDROCODONE-ACETAMINOPHEN 7.5-325 MG PO TABS
1.0000 | ORAL_TABLET | Freq: Once | ORAL | Status: AC | PRN
  Administered 2018-07-07: 1 via ORAL
  Filled 2018-07-07: qty 1

## 2018-07-07 MED ORDER — MIDAZOLAM HCL 2 MG/2ML IJ SOLN
INTRAMUSCULAR | Status: AC
  Filled 2018-07-07: qty 2

## 2018-07-07 MED ORDER — ONDANSETRON 8 MG PO TBDP: 8.0000 mg | ORAL_TABLET | Freq: Three times a day (TID) | ORAL | 0 refills | Status: DC | PRN

## 2018-07-07 MED ORDER — SUGAMMADEX SODIUM 200 MG/2ML IV SOLN
INTRAVENOUS | Status: AC
  Filled 2018-07-07: qty 4

## 2018-07-07 MED ORDER — FENTANYL CITRATE (PF) 100 MCG/2ML IJ SOLN
INTRAMUSCULAR | Status: AC
  Filled 2018-07-07: qty 4

## 2018-07-07 MED ORDER — SODIUM CHLORIDE 0.9 % IR SOLN
Status: DC | PRN
  Administered 2018-07-07: 3000 mL

## 2018-07-07 MED ORDER — LACTATED RINGERS IV SOLN
INTRAVENOUS | Status: DC
  Administered 2018-07-07: 1000 mL via INTRAVENOUS

## 2018-07-07 MED ORDER — CEFAZOLIN SODIUM-DEXTROSE 2-4 GM/100ML-% IV SOLN
2.0000 g | INTRAVENOUS | Status: AC
  Administered 2018-07-07: 2 g via INTRAVENOUS

## 2018-07-07 MED ORDER — PROPOFOL 10 MG/ML IV BOLUS
INTRAVENOUS | Status: AC
  Filled 2018-07-07: qty 20

## 2018-07-07 MED ORDER — BUPIVACAINE LIPOSOME 1.3 % IJ SUSP
INTRAMUSCULAR | Status: AC
  Filled 2018-07-07: qty 20

## 2018-07-07 SURGICAL SUPPLY — 58 items
APPLIER CLIP 5 13 M/L LIGAMAX5 (MISCELLANEOUS)
BLADE SURG SZ11 CARB STEEL (BLADE) ×3 IMPLANT
CLIP APPLIE 5 13 M/L LIGAMAX5 (MISCELLANEOUS) IMPLANT
CLOTH BEACON ORANGE TIMEOUT ST (SAFETY) ×3 IMPLANT
COVER LIGHT HANDLE STERIS (MISCELLANEOUS) ×6 IMPLANT
COVER MAYO STAND XLG (DRAPE) ×3 IMPLANT
COVER WAND RF STERILE (DRAPES) ×6 IMPLANT
DRAPE PROXIMA HALF (DRAPES) ×3 IMPLANT
DRSG TEGADERM 2-3/8X2-3/4 SM (GAUZE/BANDAGES/DRESSINGS) ×9 IMPLANT
ELECT REM PT RETURN 9FT ADLT (ELECTROSURGICAL) ×3
ELECTRODE REM PT RTRN 9FT ADLT (ELECTROSURGICAL) ×2 IMPLANT
FILTER SMOKE EVAC LAPAROSHD (FILTER) IMPLANT
GAUZE 4X4 16PLY RFD (DISPOSABLE) ×6 IMPLANT
GLOVE BIOGEL PI IND STRL 7.0 (GLOVE) ×4 IMPLANT
GLOVE BIOGEL PI IND STRL 8 (GLOVE) ×2 IMPLANT
GLOVE BIOGEL PI INDICATOR 7.0 (GLOVE) ×2
GLOVE BIOGEL PI INDICATOR 8 (GLOVE) ×1
GLOVE ECLIPSE 6.5 STRL STRAW (GLOVE) ×3 IMPLANT
GLOVE ECLIPSE 8.0 STRL XLNG CF (GLOVE) ×3 IMPLANT
GOWN STRL REUS W/TWL LRG LVL3 (GOWN DISPOSABLE) ×3 IMPLANT
GOWN STRL REUS W/TWL XL LVL3 (GOWN DISPOSABLE) ×3 IMPLANT
HANDPIECE ABLA MINERVA ENDO (MISCELLANEOUS) ×3 IMPLANT
INST SET HYSTEROSCOPY (KITS) ×3 IMPLANT
INST SET LAPROSCOPIC GYN AP (KITS) ×3 IMPLANT
IV NS 1000ML (IV SOLUTION) ×1
IV NS 1000ML BAXH (IV SOLUTION) ×2 IMPLANT
IV NS IRRIG 3000ML ARTHROMATIC (IV SOLUTION) IMPLANT
KIT TURNOVER CYSTO (KITS) ×3 IMPLANT
MANIFOLD NEPTUNE II (INSTRUMENTS) ×3 IMPLANT
MARKER SKIN DUAL TIP RULER LAB (MISCELLANEOUS) ×3 IMPLANT
NEEDLE 22X1 1/2 OR ONLY (MISCELLANEOUS) ×1
NEEDLE 22X1.5 STRL (OR ONLY) (MISCELLANEOUS) ×2 IMPLANT
NEEDLE HYPO 18GX1.5 BLUNT FILL (NEEDLE) ×3 IMPLANT
NEEDLE INSUFFLATION 14GA 120MM (NEEDLE) ×3 IMPLANT
NS IRRIG 1000ML POUR BTL (IV SOLUTION) ×3 IMPLANT
PACK BASIC III (CUSTOM PROCEDURE TRAY) ×1
PACK PERI GYN (CUSTOM PROCEDURE TRAY) ×3 IMPLANT
PACK SRG BSC III STRL LF ECLPS (CUSTOM PROCEDURE TRAY) ×2 IMPLANT
PAD ARMBOARD 7.5X6 YLW CONV (MISCELLANEOUS) ×3 IMPLANT
PAD TELFA 3X4 1S STER (GAUZE/BANDAGES/DRESSINGS) ×3 IMPLANT
SET BASIN LINEN APH (SET/KITS/TRAYS/PACK) ×3 IMPLANT
SET IRRIG Y TYPE TUR BLADDER L (SET/KITS/TRAYS/PACK) ×3 IMPLANT
SET TUBE IRRIG SUCTION NO TIP (IRRIGATION / IRRIGATOR) IMPLANT
SHEARS HARMONIC ACE PLUS 36CM (ENDOMECHANICALS) ×3 IMPLANT
SHEET LAVH (DRAPES) ×3 IMPLANT
SLEEVE ENDOPATH XCEL 5M (ENDOMECHANICALS) ×3 IMPLANT
SOLUTION ANTI FOG 6CC (MISCELLANEOUS) ×3 IMPLANT
SPONGE GAUZE 2X2 8PLY STRL LF (GAUZE/BANDAGES/DRESSINGS) ×9 IMPLANT
STAPLER VISISTAT 35W (STAPLE) ×3 IMPLANT
SUT VICRYL 0 UR6 27IN ABS (SUTURE) ×3 IMPLANT
SYR 10ML LL (SYRINGE) ×3 IMPLANT
SYR 20CC LL (SYRINGE) ×3 IMPLANT
SYRINGE 20CC LL (MISCELLANEOUS) ×3 IMPLANT
TROCAR ENDO BLADELESS 11MM (ENDOMECHANICALS) ×3 IMPLANT
TROCAR XCEL NON-BLD 5MMX100MML (ENDOMECHANICALS) ×3 IMPLANT
TUBING INSUF HEATED (TUBING) ×3 IMPLANT
WARMER LAPAROSCOPE (MISCELLANEOUS) ×3 IMPLANT
YANKAUER SUCT BULB TIP 10FT TU (MISCELLANEOUS) ×3 IMPLANT

## 2018-07-07 NOTE — Transfer of Care (Signed)
Immediate Anesthesia Transfer of Care Note  Patient: Natalie Foster  Procedure(s) Performed: LAPAROSCOPIC BILATERAL SALPINGECTOMY (Bilateral ) DILATATION AND CURETTAGE/HYSTEROSCOPY WITH MINERVA ENDOMETRIAL ABLATION (N/A Vagina )  Patient Location: PACU  Anesthesia Type:General  Level of Consciousness: awake, alert , oriented and patient cooperative  Airway & Oxygen Therapy: Patient Spontanous Breathing  Post-op Assessment: Report given to RN and Post -op Vital signs reviewed and stable  Post vital signs: Reviewed and stable  Last Vitals:  Vitals Value Taken Time  BP    Temp    Pulse    Resp    SpO2      Last Pain:  Vitals:   07/07/18 1043  TempSrc: Oral  PainSc: 0-No pain      Patients Stated Pain Goal: 7 (07/07/18 1043)  Complications: No apparent anesthesia complications

## 2018-07-07 NOTE — Anesthesia Procedure Notes (Signed)
Procedure Name: Intubation Date/Time: 07/07/2018 12:14 PM Performed by: Andree Elk, Amy A, CRNA Pre-anesthesia Checklist: Patient identified, Patient being monitored, Timeout performed, Emergency Drugs available and Suction available Patient Re-evaluated:Patient Re-evaluated prior to induction Oxygen Delivery Method: Circle system utilized Preoxygenation: Pre-oxygenation with 100% oxygen Induction Type: IV induction Ventilation: Mask ventilation without difficulty Laryngoscope Size: Mac and 3 Grade View: Grade I Tube type: Oral Tube size: 7.0 mm Number of attempts: 1 Airway Equipment and Method: Stylet Placement Confirmation: ETT inserted through vocal cords under direct vision,  positive ETCO2 and breath sounds checked- equal and bilateral Secured at: 21 cm Tube secured with: Tape Dental Injury: Teeth and Oropharynx as per pre-operative assessment

## 2018-07-07 NOTE — Anesthesia Preprocedure Evaluation (Addendum)
Anesthesia Evaluation  Patient identified by MRN, date of birth, ID band Patient awake    Reviewed: Allergy & Precautions, NPO status , Patient's Chart, lab work & pertinent test results  Airway Mallampati: I  TM Distance: >3 FB Neck ROM: Full    Dental no notable dental hx. (+) Teeth Intact   Pulmonary neg pulmonary ROS, former smoker,    Pulmonary exam normal breath sounds clear to auscultation       Cardiovascular Exercise Tolerance: Good negative cardio ROS Normal cardiovascular examI Rhythm:Regular Rate:Normal     Neuro/Psych Anxiety ?GADD on meds negative neurological ROS  negative psych ROS   GI/Hepatic negative GI ROS, Neg liver ROS,   Endo/Other  negative endocrine ROS  Renal/GU negative Renal ROS  negative genitourinary   Musculoskeletal negative musculoskeletal ROS (+)   Abdominal   Peds negative pediatric ROS (+)  Hematology  (+) J[facility]H'S WITNESS  Anesthesia Other Findings   Reproductive/Obstetrics negative OB ROS                            Anesthesia Physical Anesthesia Plan  ASA: II  Anesthesia Plan: General   Post-op Pain Management:    Induction: Intravenous  PONV Risk Score and Plan:   Airway Management Planned: Oral ETT  Additional Equipment:   Intra-op Plan:   Post-operative Plan: Extubation in OR  Informed Consent: I have reviewed the patients History and Physical, chart, labs and discussed the procedure including the risks, benefits and alternatives for the proposed anesthesia with the patient or authorized representative who has indicated his/her understanding and acceptance.   Dental advisory given  Plan Discussed with: CRNA  Anesthesia Plan Comments: (JW - NO Blood Product for any reason)        Anesthesia Quick Evaluation

## 2018-07-07 NOTE — H&P (Signed)
Preoperative History and Physical  Natalie Foster is a 32 y.o. [phone] with Patient's last menstrual period was 06/05/2018 (lmp unknown). admitted for a laparoscopic bilateral salpingectomy for permanent sterilization and hysteroscopy uterine curettage Minerva endometrial ablation.  Internal office referral for discussion regarding dysmenorrhea and BCM issues Reviewed her note with Dr Emelda Fear Told pt I do not subscribe to pelvic vein congestion as a cause of pelvic pain, it is a finding but not pathologic, the surrounding tissue shows no evidence of engorgement or other tissue changes which would indicate pain Sonogram is reviewed Discussed options for birth control and menstrual control  PMH:    Past Medical History:  Diagnosis Date  . HSP (Henoch-Schonlein purpura) nephritis (HCC)     PSH:     Past Surgical History:  Procedure Laterality Date  . LAMINOTOMY    . LUMBAR DISC SURGERY    . MOUTH SURGERY    . NO PAST SURGERIES      POb/GynH:      OB History    Gravida  2   Para  2   Term  2   Preterm      AB      Living  2     SAB      TAB      Ectopic      Multiple      Live Births  1           SH:   Social History   Tobacco Use  . Smoking status: Former Smoker    Packs/day: 0.25    Years: 3.00    Pack years: 0.75    Types: Cigarettes    Last attempt to quit: 07/01/1988    Years since quitting: 30.0  . Smokeless tobacco: Never Used  . Tobacco comment: social smoker in college.  Substance Use Topics  . Alcohol use: No  . Drug use: No    FH:    Family History  Problem Relation Age of Onset  . Cancer Paternal Grandmother        lung  . Stroke Paternal Grandfather   . Cancer Paternal Grandfather        lung     Allergies:  Allergies  Allergen Reactions  . Cortisone Nausea And Vomiting  . Pineapple Hives and Itching    Medications:       Current Facility-Administered Medications:  .  bupivacaine liposome (EXPAREL) 1.3 %  injection 266 mg, 20 mL, Infiltration, Once, Eure, Luther H, MD .  ceFAZolin (ANCEF) IVPB 2g/100 mL premix, 2 g, Intravenous, On Call to OR, Lazaro Arms, MD .  lactated ringers infusion, , Intravenous, Continuous, Lemont Fillers, Fara Boros, MD, Last Rate: 50 mL/hr at 07/07/18 1056, 1,000 mL at 07/07/18 1056  Review of Systems:   Review of Systems  Constitutional: Negative for fever, chills, weight loss, malaise/fatigue and diaphoresis.  HENT: Negative for hearing loss, ear pain, nosebleeds, congestion, sore throat, neck pain, tinnitus and ear discharge.   Eyes: Negative for blurred vision, double vision, photophobia, pain, discharge and redness.  Respiratory: Negative for cough, hemoptysis, sputum production, shortness of breath, wheezing and stridor.   Cardiovascular: Negative for chest pain, palpitations, orthopnea, claudication, leg swelling and PND.  Gastrointestinal: Positive for abdominal pain. Negative for heartburn, nausea, vomiting, diarrhea, constipation, blood in stool and melena.  Genitourinary: Negative for dysuria, urgency, frequency, hematuria and flank pain.  Musculoskeletal: Negative for myalgias, back pain, joint pain and falls.  Skin: Negative for itching and  rash.  Neurological: Negative for dizziness, tingling, tremors, sensory change, speech change, focal weakness, seizures, loss of consciousness, weakness and headaches.  Endo/Heme/Allergies: Negative for environmental allergies and polydipsia. Does not bruise/bleed easily.  Psychiatric/Behavioral: Negative for depression, suicidal ideas, hallucinations, memory loss and substance abuse. The patient is not nervous/anxious and does not have insomnia.      PHYSICAL EXAM:  Blood pressure 112/76, temperature 98.6 F (37 C), temperature source Oral, resp. rate (!) 24, last menstrual period 06/05/2018, SpO2 96 %, currently breastfeeding.    Vitals reviewed. Constitutional: She is oriented to person, place, and time. She appears  well-developed and well-nourished.  HENT:  Head: Normocephalic and atraumatic.  Right Ear: External ear normal.  Left Ear: External ear normal.  Nose: Nose normal.  Mouth/Throat: Oropharynx is clear and moist.  Eyes: Conjunctivae and EOM are normal. Pupils are equal, round, and reactive to light. Right eye exhibits no discharge. Left eye exhibits no discharge. No scleral icterus.  Neck: Normal range of motion. Neck supple. No tracheal deviation present. No thyromegaly present.  Cardiovascular: Normal rate, regular rhythm, normal heart sounds and intact distal pulses.  Exam reveals no gallop and no friction rub.   No murmur heard. Respiratory: Effort normal and breath sounds normal. No respiratory distress. She has no wheezes. She has no rales. She exhibits no tenderness.  GI: Soft. Bowel sounds are normal. She exhibits no distension and no mass. There is tenderness. There is no rebound and no guarding.  Genitourinary:       Vulva is normal without lesions Vagina is pink moist without discharge Cervix normal in appearance and pap is normal Uterus is normal size, contour, position, consistency, mobility, non-tender Adnexa is negative with normal sized ovaries by sonogram  Musculoskeletal: Normal range of motion. She exhibits no edema and no tenderness.  Neurological: She is alert and oriented to person, place, and time. She has normal reflexes. She displays normal reflexes. No cranial nerve deficit. She exhibits normal muscle tone. Coordination normal.  Skin: Skin is warm and dry. No rash noted. No erythema. No pallor.  Psychiatric: She has a normal mood and affect. Her behavior is normal. Judgment and thought content normal.    Labs: Results for orders placed or performed during the hospital encounter of 07/01/18 (from the past 336 hour(s))  CBC   Collection Time: 07/01/18  8:30 AM  Result Value Ref Range   WBC 4.8 4.0 - 10.5 K/uL   RBC 4.08 3.87 - 5.11 MIL/uL   Hemoglobin 11.6 (L)  12.0 - 15.0 g/dL   HCT 16.1 09.6 - 04.5 %   MCV 90.2 80.0 - 100.0 fL   MCH 28.4 26.0 - 34.0 pg   MCHC 31.5 30.0 - 36.0 g/dL   RDW 40.9 81.1 - 91.4 %   Platelets 299 150 - 400 K/uL   nRBC 0.0 0.0 - 0.2 %  Comprehensive metabolic panel   Collection Time: 07/01/18  8:30 AM  Result Value Ref Range   Sodium 137 135 - 145 mmol/L   Potassium 3.6 3.5 - 5.1 mmol/L   Chloride 106 98 - 111 mmol/L   CO2 25 22 - 32 mmol/L   Glucose, Bld 84 70 - 99 mg/dL   BUN 12 6 - 20 mg/dL   Creatinine, Ser 7.82 0.44 - 1.00 mg/dL   Calcium 9.0 8.9 - 95.6 mg/dL   Total Protein 7.5 6.5 - 8.1 g/dL   Albumin 4.0 3.5 - 5.0 g/dL   AST 20 15 - 41  U/L   ALT 22 0 - 44 U/L   Alkaline Phosphatase 58 38 - 126 U/L   Total Bilirubin 0.6 0.3 - 1.2 mg/dL   GFR calc non Af Amer >60 >60 mL/min   GFR calc Af Amer >60 >60 mL/min   Anion gap 6 5 - 15  hCG, quantitative, pregnancy   Collection Time: 07/01/18  8:30 AM  Result Value Ref Range   hCG, Beta Chain, Quant, S <1 <5 mIU/mL  Rapid HIV screen (HIV 1/2 Ab+Ag)   Collection Time: 07/01/18  8:30 AM  Result Value Ref Range   HIV-1 P24 Antigen - HIV24 NON REACTIVE NON REACTIVE   HIV 1/2 Antibodies NON REACTIVE NON REACTIVE   Interpretation (HIV Ag Ab)      A non reactive test result means that HIV 1 or HIV 2 antibodies and HIV 1 p24 antigen were not detected in the specimen.  Urinalysis, Routine w reflex microscopic   Collection Time: 07/01/18  8:30 AM  Result Value Ref Range   Color, Urine YELLOW YELLOW   APPearance CLOUDY (A) CLEAR   Specific Gravity, Urine 1.025 1.005 - 1.030   pH 6.0 5.0 - 8.0   Glucose, UA NEGATIVE NEGATIVE mg/dL   Hgb urine dipstick SMALL (A) NEGATIVE   Bilirubin Urine NEGATIVE NEGATIVE   Ketones, ur NEGATIVE NEGATIVE mg/dL   Protein, ur NEGATIVE NEGATIVE mg/dL   Nitrite NEGATIVE NEGATIVE   Leukocytes, UA MODERATE (A) NEGATIVE   RBC / HPF 0-5 0 - 5 RBC/hpf   WBC, UA 0-5 0 - 5 WBC/hpf   Bacteria, UA MANY (A) NONE SEEN   Squamous  Epithelial / LPF 11-20 0 - 5   Mucus PRESENT     EKG: No orders found for this or any previous visit.  Imaging Studies: No results found.    Assessment: Parous female desires permanent sterilization Dysmenorrhea Patient Active Problem List   Diagnosis Date Noted  . Abdominal pain, RLQ (right lower quadrant) 05/11/2018    Plan: Laparoscopic bilateral salpingectomy for sterilization + Hysteroscopy uterine curettage with endometrial ablation  Lazaro ArmsLuther H Eure 07/07/2018 11:40 AM

## 2018-07-07 NOTE — Op Note (Signed)
2 surgeries:  Preoperative Diagnosis:  1.  Multiparous female desires permanent sterilization                                          2.  Elects to have bilateral salpingectomy for ovarian cancer                                                     prophylaxis  Postoperative Diagnosis:  Same as above  Procedure:  Laparoscopic Bilateral Salpingectomy  Surgeon:  Rockne CoonsLuther H Eure  Jr MD  Anaesthesia: general  Findings:  Patient had normal pelvic anatomy and no intraperitoneal abnormalities.  Description of Operation:  Patient was taken to the OR and placed into supine position where she underwent general anaesthesia.  She was placed in the dorsal lithotomy position and prepped and draped in the usual sterile fashion.  An incision was made in the umbilicus and dissection taken down to the rectus fascia which was incised and opened.  The non bladed trocar was then placed and the peritoneal cavity was insufflated.  The above noted findings were observed.  Additional trocars were placed in the right and left lower quadrants under direct visualization without difficulty.  The Harmonic scalpel was employed and salpingectomy of both the right and left tubes was performed.   The tubes were removed from the peritoneal cavity and sent to pathology.  There was good hemostasis bilaterally.  The fascia, peritoneum and subcutaneous tissue were closed using 0 vicryl.  The skin was closed using staples.  Exparel 266 mg 20 cc was injected in the 3 incision trocar sites. The patient was awakened from anaesthesia and taken to the PACU with all counts being correct x 3.  The patient received  2 gram of ancef andToradol 30 mg IV preoperatively.  Lazaro ArmsLuther H Eure 07/07/2018 1:09 PM  Preoperative diagnosis:  1.   Dysmenorrhea                                         2.     Postoperative diagnoses: Same as above   Procedure: Hysteroscopy, uterine curettage, endometrial ablation using Minerva  Surgeon: Lazaro ArmsLuther H Eure    Anesthesia: Laryngeal mask airway  Findings: The endometrium was normal. There were no fibroid or other abnormalities.  Description of operation: The patient was taken to the operating room and placed in the supine position. She underwent general anesthesia using the laryngeal mask airway. She was placed in the dorsal lithotomy position and prepped and draped in the usual sterile fashion. A Graves speculum was placed and the anterior cervical lip was grasped with a single-tooth tenaculum. The cervix was dilated serially to allow passage of the hysteroscope. Diagnostic hysteroscopy was performed and was found to be normal. A vigorous uterine curettage was then performed and all tissue sent to pathology for evaluation.  I then proceeded to perform the Minerva endometrial ablation.   The uterus sounded to 8 cm The handpiece was attached to the Minerva power source/machine and the handpiece passed the checklist. The array was squeezed down to remove all of the air present.  The array was then  place into the endometrial cavity and deployed to a length of 5 cm. The handpiece confirmed appropriate width by being in the green portion of the visual dial. The cervical cuff was then inflated to the point the CO2 indicator was in the green. The endometrial integrity check was then performed and integrity sequence was confirmed x 2. The heating was then begun and carried out for a total of 2 minutes(which is standard therapy time). When the plasma cycle was finished,  the cervical cuff was deflated and the array was removed with tissue present on the silicon membrane. There was appropriate post Minerva bleeding and uterine discharge.     All of the equipment worked well throughout the procedure.  The patient was awakened from anesthesia and taken to the recovery room in good stable condition all counts were correct. She received 2 g of Ancef and 30 mg of Toradol preoperatively. She will be discharged from  the recovery room and followed up in the office in 1- 2 weeks.   She can expect 4 weeks of post procedure bloody watery discharge  Lazaro Arms, MD  07/07/2018 1:09 PM

## 2018-07-07 NOTE — Discharge Instructions (Signed)
Laparoscopic Tubal Ligation, Care After Refer to this sheet in the next few weeks. These instructions provide you with information about caring for yourself after your procedure. Your health care provider may also give you more specific instructions. Your treatment has been planned according to current medical practices, but problems sometimes occur. Call your health care provider if you have any problems or questions after your procedure. What can I expect after the procedure? After the procedure, it is common to have:  A sore throat.  Discomfort in your shoulder.  Mild discomfort or cramping in your abdomen.  Gas pains.  Pain or soreness in the area where the surgical cut (incision) was made.  A bloated feeling.  Tiredness.  Nausea.  Vomiting.  Follow these instructions at home: Medicines  Take over-the-counter and prescription medicines only as told by your health care provider.  Do not take aspirin because it can cause bleeding.  Do not drive or operate heavy machinery while taking prescription pain medicine. Activity  Rest for the rest of the day.  Return to your normal activities as told by your health care provider. Ask your health care provider what activities are safe for you. Incision care   Follow instructions from your health care provider about how to take care of your incision. Make sure you: ? Wash your hands with soap and water before you change your bandage (dressing). If soap and water are not available, use hand sanitizer. ? Change your dressing as told by your health care provider. ? Leave stitches (sutures) in place. They may need to stay in place for 2 weeks or longer.  Check your incision area every day for signs of infection. Check for: ? More redness, swelling, or pain. ? More fluid or blood. ? Warmth. ? Pus or a bad smell. Other Instructions  Do not take baths, swim, or use a hot tub until your health care provider approves. You may take  showers.  Keep all follow-up visits as told by your health care provider. This is important.  Have someone help you with your daily household tasks for the first few days. Contact a health care provider if:  You have more redness, swelling, or pain around your incision.  Your incision feels warm to the touch.  You have pus or a bad smell coming from your incision.  The edges of your incision break open after the sutures have been removed.  Your pain does not improve after 2-3 days.  You have a rash.  You repeatedly become dizzy or light-headed.  Your pain medicine is not helping.  You are constipated. Get help right away if:  You have a fever.  You faint.  You have increasing pain in your abdomen.  You have severe pain in one or both of your shoulders.  You have fluid or blood coming from your sutures or from your vagina.  You have shortness of breath or difficulty breathing.  You have chest pain or leg pain.  You have ongoing nausea, vomiting, or diarrhea. This information is not intended to replace advice given to you by your health care provider. Make sure you discuss any questions you have with your health care provider. Document Released: 02/21/2005 Document Revised: 01/07/2016 Document Reviewed: 07/15/2015 Elsevier Interactive Patient Education  2018 [place]. Endometrial Ablation Endometrial ablation is a procedure that destroys the thin inner layer of the lining of the uterus (endometrium). This procedure may be done:  To stop heavy periods.  To stop bleeding that  is causing anemia.  To control irregular bleeding.  To treat bleeding caused by small tumors (fibroids) in the endometrium.  This procedure is often an alternative to major surgery, such as removal of the uterus and cervix (hysterectomy). As a result of this procedure:  You may not be able to have children. However, if you are premenopausal (you have not gone through menopause): ? You  may still have a small chance of getting pregnant. ? You will need to use a reliable method of birth control after the procedure to prevent pregnancy.  You may stop having a menstrual period, or you may have only a small amount of bleeding during your period. Menstruation may return several years after the procedure.  Tell a health care provider about:  Any allergies you have.  All medicines you are taking, including vitamins, herbs, eye drops, creams, and over-the-counter medicines.  Any problems you or family members have had with the use of anesthetic medicines.  Any blood disorders you have.  Any surgeries you have had.  Any medical conditions you have. What are the risks? Generally, this is a safe procedure. However, problems may occur, including:  A hole (perforation) in the uterus or bowel.  Infection of the uterus, bladder, or vagina.  Bleeding.  Damage to other structures or organs.  An air bubble in the lung (air embolus).  Problems with pregnancy after the procedure.  Failure of the procedure.  Decreased ability to diagnose cancer in the endometrium.  What happens before the procedure?  You will have tests of your endometrium to make sure there are no pre-cancerous cells or cancer cells present.  You may have an ultrasound of the uterus.  You may be given medicines to thin the endometrium.  Ask your health care provider about: ? Changing or stopping your regular medicines. This is especially important if you take diabetes medicines or blood thinners. ? Taking medicines such as aspirin and ibuprofen. These medicines can thin your blood. Do not take these medicines before your procedure if your doctor tells you not to.  Plan to have someone take you home from the hospital or clinic. What happens during the procedure?  You will lie on an exam table with your feet and legs supported as in a pelvic exam.  To lower your risk of infection: ? Your health care  team will wash or sanitize their hands and put on germ-free (sterile) gloves. ? Your genital area will be washed with soap.  An IV tube will be inserted into one of your veins.  You will be given a medicine to help you relax (sedative).  A surgical instrument with a light and camera (resectoscope) will be inserted into your vagina and moved into your uterus. This allows your surgeon to see inside your uterus.  Endometrial tissue will be removed using one of the following methods: ? Radiofrequency. This method uses a radiofrequency-alternating electric current to remove the endometrium. ? Cryotherapy. This method uses extreme cold to freeze the endometrium. ? Heated-free liquid. This method uses a heated saltwater (saline) solution to remove the endometrium. ? Microwave. This method uses high-energy microwaves to heat up the endometrium and remove it. ? Thermal balloon. This method involves inserting a catheter with a balloon tip into the uterus. The balloon tip is filled with heated fluid to remove the endometrium. The procedure may vary among health care providers and hospitals. What happens after the procedure?  Your blood pressure, heart rate, breathing rate, and blood oxygen  level will be monitored until the medicines you were given have worn off.  As tissue healing occurs, you may notice vaginal bleeding for 4-6 weeks after the procedure. You may also experience: ? Cramps. ? Thin, watery vaginal discharge that is light pink or brown in color. ? A need to urinate more frequently than usual. ? Nausea.  Do not drive for 24 hours if you were given a sedative.  Do not have sex or insert anything into your vagina until your health care provider approves. Summary  Endometrial ablation is done to treat the many causes of heavy menstrual bleeding.  The procedure may be done only after medications have been tried to control the bleeding.  Plan to have someone take you home from the  hospital or clinic. This information is not intended to replace advice given to you by your health care provider. Make sure you discuss any questions you have with your health care provider. Document Released: 06/13/2004 Document Revised: 08/21/2016 Document Reviewed: 08/21/2016 Elsevier Interactive Patient Education  2017 [employer]

## 2018-07-08 ENCOUNTER — Encounter (HOSPITAL_COMMUNITY): Payer: Self-pay | Admitting: Obstetrics & Gynecology

## 2018-07-08 NOTE — Anesthesia Postprocedure Evaluation (Addendum)
Anesthesia Post Note  Patient: Natalie Foster  Procedure(s) Performed: LAPAROSCOPIC BILATERAL SALPINGECTOMY (Bilateral ) DILATATION AND CURETTAGE/HYSTEROSCOPY WITH MINERVA ENDOMETRIAL ABLATION (N/A Vagina )  Patient location during evaluation: Short Stay Anesthesia Type: General Level of consciousness: awake and alert and patient cooperative Pain management: pain level controlled Vital Signs Assessment: post-procedure vital signs reviewed and stable Respiratory status: spontaneous breathing Cardiovascular status: stable Postop Assessment: no apparent nausea or vomiting Anesthetic complications: no Comments: Late entry 11/21/[id number] T. Natalee Tomkiewicz CRNA     Last Vitals:  Vitals:   07/07/18 1414 07/07/18 1426  BP:  110/74  Pulse: 78 71  Resp: (!) 37 18  Temp:  36.9 C  SpO2: 99% 97%    Last Pain:  Vitals:   07/07/18 1426  TempSrc: Oral  PainSc: 2                  Fabrice Dyal

## 2018-07-14 ENCOUNTER — Ambulatory Visit (INDEPENDENT_AMBULATORY_CARE_PROVIDER_SITE_OTHER): Payer: BLUE CROSS/BLUE SHIELD | Admitting: Obstetrics & Gynecology

## 2018-07-14 ENCOUNTER — Other Ambulatory Visit: Payer: Self-pay

## 2018-07-14 ENCOUNTER — Encounter: Payer: Self-pay | Admitting: Obstetrics & Gynecology

## 2018-07-14 VITALS — BP 111/70 | HR 84 | Ht 66.0 in | Wt 195.0 lb

## 2018-07-14 DIAGNOSIS — Z9889 Other specified postprocedural states: Secondary | ICD-10-CM

## 2018-07-14 NOTE — Progress Notes (Signed)
  HPI: Patient returns for routine postoperative follow-up having undergone laparoscopic tubal ligation + endometrial ablation on 07/07/2018.  The patient's immediate postoperative recovery has been unremarkable. Since hospital discharge the patient reports normal post op recovery.   Current Outpatient Medications: escitalopram (LEXAPRO) 5 MG tablet, Take 5 mg by mouth daily. , Disp: , Rfl: 2 Melatonin 10 MG TABS, Take 10 mg by mouth at bedtime., Disp: , Rfl:  Multiple Vitamin (MULTIVITAMIN WITH MINERALS) TABS tablet, Take 1 tablet by mouth daily., Disp: , Rfl:  Zinc Acetate, Oral, (ZINC ACETATE PO), Take 10 mg by mouth daily., Disp: , Rfl:  HYDROcodone-acetaminophen (NORCO/VICODIN) 5-325 MG tablet, Take 1 tablet by mouth every 6 (six) hours as needed. (Patient not taking: Reported on 07/14/2018), Disp: 15 tablet, Rfl: 0 ketorolac (TORADOL) 10 MG tablet, Take 1 tablet (10 mg total) by mouth every 8 (eight) hours as needed. (Patient not taking: Reported on 07/14/2018), Disp: 15 tablet, Rfl: 0  No current facility-administered medications for this visit.     Blood pressure 111/70, pulse 84, height 5\' 6"  (1.676 m), weight 195 lb (88.5 kg), currently breastfeeding.  Physical Exam: Incisions x 3 all healing well  Diagnostic Tests:   Pathology: benign  Impression: S/p BTL + endo ablation  Plan:   Follow up: 1  years  Lazaro ArmsLuther H Tranisha Tissue, MD

## 2018-08-04 ENCOUNTER — Encounter (HOSPITAL_COMMUNITY): Payer: Self-pay

## 2018-08-04 ENCOUNTER — Ambulatory Visit (HOSPITAL_COMMUNITY): Payer: BLUE CROSS/BLUE SHIELD | Attending: Neurosurgery

## 2018-08-04 ENCOUNTER — Other Ambulatory Visit: Payer: Self-pay

## 2018-08-04 DIAGNOSIS — G8929 Other chronic pain: Secondary | ICD-10-CM | POA: Insufficient documentation

## 2018-08-04 DIAGNOSIS — M6281 Muscle weakness (generalized): Secondary | ICD-10-CM | POA: Insufficient documentation

## 2018-08-04 DIAGNOSIS — R29898 Other symptoms and signs involving the musculoskeletal system: Secondary | ICD-10-CM | POA: Diagnosis present

## 2018-08-04 DIAGNOSIS — M5441 Lumbago with sciatica, right side: Secondary | ICD-10-CM | POA: Diagnosis not present

## 2018-08-04 DIAGNOSIS — R29818 Other symptoms and signs involving the nervous system: Secondary | ICD-10-CM | POA: Diagnosis present

## 2018-08-04 NOTE — Therapy (Signed)
[facility] [address] [place], [place], [address] Phone: [phone]   Fax:  [phone]  Physical Therapy Evaluation  Patient Details  Name: Natalie Foster MRN: [id number] Date of Birth: Apr 23, 1986 Referring Provider (PT): Lisbeth Renshaw, MD   Encounter Date: 08/04/2018  PT End of Session - 08/04/18 1606    Visit Number  1    Number of Visits  9    Date for PT Re-Evaluation  09/01/18    Authorization Type  BC[employer] Other    Authorization Time Period  08/04/18 to 09/01/18    Authorization - Visit Number  1    Authorization - Number of Visits  30   PT/OT/Chiro   PT Start Time  1347    PT Stop Time  1426    PT Time Calculation (min)  39 min    Activity Tolerance  Patient tolerated treatment well;No increased pain    Behavior During Therapy  WFL for tasks assessed/performed       Past Medical History:  Diagnosis Date  . HSP (Henoch-Schonlein purpura) nephritis (HCC)     Past Surgical History:  Procedure Laterality Date  . LAMINOTOMY    . LAPAROSCOPIC BILATERAL SALPINGECTOMY Bilateral 07/07/2018   Procedure: LAPAROSCOPIC BILATERAL SALPINGECTOMY;  Surgeon: Lazaro Arms, MD;  Location: [facility];  Service: Gynecology;  Laterality: Bilateral;  . LUMBAR DISC SURGERY    . MOUTH SURGERY    . NO PAST SURGERIES      There were no vitals filed for this visit.   Subjective Assessment - 08/04/18 1351    Subjective  Pt states that she had a discectomy in August 2019. She states that prior to her surgery, she was unable to stand up straight; since the surgery, she can stand up straight, but she still has pain. She feels like her R leg is hurting and hurts down the side, all the way to the side of her ankle. She describes this feeling as potentially restless legs, which is new since the surgery. She has pelvic congestion syndrome (varicose veins in this region) so she thought this pain was due to that. The pain down the RLE has worsened  since the surgery, and her surgeon is aware of this. He told her he thinks it is residual nerve pain because her nerve was so pinched from the buldging disc. this pain isn't crippling, it is just aggravating. Her pain is worse at the end of the day, she can't lay on that side because it increases her pain, and she can't lay on her back due to back pain. States that as long as she is up and moving, she feels pretty good.     How long can you sit comfortably?  15 mins    How long can you stand comfortably?  15 mins    How long can you walk comfortably?  no problem with continuous walking yet    Patient Stated Goals  manage this pain, figure out what is causing it    Currently in Pain?  No/denies         [facility] PT Assessment - 08/04/18 0001      Assessment   Medical Diagnosis  disc displacement, lumbar     Referring Provider (PT)  Lisbeth Renshaw, MD    Onset Date/Surgical Date  04/15/18    Next MD Visit  none scheduled yet   had her 24month f/u recently and he was pleased with progress   Prior Therapy  none      Precautions   Precautions  None      Balance Screen   Has the patient fallen in the past 6 months  No    Has the patient had a decrease in activity level because of a fear of falling?   No    Is the patient reluctant to leave their home because of a fear of falling?   No      Prior Function   Level of Independence  Independent    Vocation  Part time employment    Leisure  play with children      Observation/Other Assessments   Focus on Therapeutic Outcomes (FOTO)   to be completed next session      Sensation   Light Touch  Appears Intact    Additional Comments  L2 DTRs: 3+ R, 2+ L      ROM / Strength   AROM / PROM / Strength  AROM;Strength      AROM   AROM Assessment Site  Lumbar    Lumbar Flexion  WNL, non-painful     Lumbar Extension  25% limited, hinge point ~T11-L1; pain in R lower back    Lumbar - Right Side Bend  knee joint, pain on R    Lumbar - Left Side  Bend  mid-lower thigh, stretch on R    Lumbar - Right Rotation  25% limited, tight on R    Lumbar - Left Rotation  25% limited, tight on R      Strength   Strength Assessment Site  Hip;Knee;Ankle    Right Hip Flexion  4+/5    Right Hip Extension  3+/5    Right Hip ABduction  4/5    Left Hip Flexion  5/5    Left Hip Extension  3+/5    Left Hip ABduction  4/5    Right Knee Flexion  4+/5    Right Knee Extension  5/5    Left Knee Flexion  4+/5    Left Knee Extension  5/5    Right Ankle Dorsiflexion  5/5    Left Ankle Dorsiflexion  5/5      Flexibility   Soft Tissue Assessment /Muscle Length  yes    Hamstrings  R tighter than L    Quadriceps  +Ely's BLE      Palpation   Spinal mobility  mild-mod hypomobile thoracic spine and upper lumabr    Palpation comment  palpable knot in pt's lumbar spine mm and tightness throughout paraspinals, QL, lats, all recreated same LBP; scar with some restrictions and tender to palpation; increased restrictions R glutes, piriformis, tender to palpation, recreated RLE referred pain down to knee      Special Tests    Special Tests  Lumbar    Lumbar Tests  Slump Test;Straight Leg Raise      Slump test   Findings  Positive    Side  Right      Straight Leg Raise   Findings  Positive    Side   Right    Comment  pain into buttock at ~<45deg      Balance   Balance Assessed  Yes      Static Standing Balance   Static Standing - Balance Support  No upper extremity supported    Static Standing Balance -  Activities   Single Leg Stance - Right Leg;Single Leg Stance - Left Leg    Static Standing - Comment/# of Minutes  R:  52sec L: 56sec      Standardized Balance Assessment   Standardized Balance Assessment  Five Times Sit to Stand    Five times sit to stand comments   7.5sec, chair, no UE           Objective measurements completed on examination: See above findings.         PT Education - 08/04/18 1606    Education Details  exam findings,  HEP, POC    Person(s) Educated  Patient    Methods  Explanation;Demonstration;Handout    Comprehension  Verbalized understanding;Returned demonstration       PT Short Term Goals - 08/04/18 1608      PT SHORT TERM GOAL #1   Title  Pt will have improved MMT by 1/2 grade in order to reduce LBP and RLE radicular pain.    Time  2    Period  Weeks    Target Date  08/18/18      PT SHORT TERM GOAL #2   Title  Pt will have improved lumbar AROM to [facility] in order to demo reduced soft tissue restriction and minimize her overall LBP.     Time  2    Period  Weeks    Status  New        PT Long Term Goals - 08/04/18 1609      PT LONG TERM GOAL #1   Title  Pt will have improved MMT by 1 grade in order to further reduce LBP and maximize return to PLOF.     Time  4    Period  Weeks    Status  New    Target Date  09/01/18      PT LONG TERM GOAL #2   Title  Pt will report being able to sit and stand for 45 mins or longer in order to allow her to prepare a small meal and enjoy it with her family with greater ease.     Time  4    Period  Weeks    Status  New      PT LONG TERM GOAL #3   Title  Pt will have negative SLR and slump testing on the RLE to demo reduced nerve irritation and reduce her RLE radicular/referred pain.     Time  4    Period  Weeks    Status  New      PT LONG TERM GOAL #4   Title  Pt will have reduced pain to palpation throughout lumbar and hip mm to demo reduced restrictions in order to decrease her overall pain.     Time  4    Period  Weeks    Status  New             Plan - 08/04/18 1607    Clinical Impression Statement  Pt is pleasant 32YO F who presents to OPPT s/p R L4-5 laminotomy (hemilaminotomy) with decompression. Pt presenting to therapy stating that her RLE pain has worsened since her surgery; Dr. Conchita ParisNundkumar is aware and feels this is due to residual nerve pain due to how compressed her nerve was prior to the surgery. She presents with deficits in core,  BLE, functional strength, mild deficits in lumbar ROM, as well as increased soft tissue restrictions throughout lumbar spine and hip mm. Some hypomobility noted in thoracic and upper lumbar spine. Palpation to lumbar mm recreated her same back pain and palpation to her glutes and piriformis recreated her same  RLE pain, though just down to her knee. Myotomes and dermatomes WNL, DTRs hyper-reflexive on R, and slump and SLR +on the R indicating neural involvement/s/s. Pt presenting with s/s consistent with both neural and MSK involvement. Pt needs skilled PT intervention in order to reduce her pain and maximize return to PLOF.     Clinical Presentation  Stable    Clinical Presentation due to:  see flowsheets    Clinical Decision Making  Low    Rehab Potential  Good    PT Frequency  2x / week    PT Duration  4 weeks    PT Treatment/Interventions  ADLs/Self Care Home Management;Aquatic Therapy;Cryotherapy;Electrical Stimulation;Moist Heat;Ultrasound;DME Instruction;Gait training;Stair training;Functional mobility training;Therapeutic activities;Therapeutic exercise;Balance training;Neuromuscular re-education;Patient/family education;Manual techniques;Scar mobilization;Passive range of motion;Dry needling;Spinal Manipulations;Joint Manipulations    PT Next Visit Plan  review goals, administer FOTO; initiate BLE/hip stretching, sciatic nerve glides, core, BLE, functional strengthening, dynamic balance training, manual for joint mobility and soft tissue restrictions    PT Home Exercise Plan  eval: supine piriformis stretch, SKTC, DKTC    Consulted and Agree with Plan of Care  Patient       Patient will benefit from skilled therapeutic intervention in order to improve the following deficits and impairments:  Decreased activity tolerance, Decreased endurance, Decreased range of motion, Decreased scar mobility, Decreased strength, Hypomobility, Increased fascial restricitons, Increased muscle spasms, Impaired  flexibility, Improper body mechanics, Postural dysfunction, Pain  Visit Diagnosis: Chronic right-sided low back pain with right-sided sciatica - Plan: PT plan of care cert/re-cert  Muscle weakness (generalized) - Plan: PT plan of care cert/re-cert  Other symptoms and signs involving the musculoskeletal system - Plan: PT plan of care cert/re-cert  Other symptoms and signs involving the nervous system - Plan: PT plan of care cert/re-cert     Problem List Patient Active Problem List   Diagnosis Date Noted  . Abdominal pain, RLQ (right lower quadrant) 05/11/2018        Jac Canavan PT, DPT   [facility] [address] [place], [place], [address] Phone: [phone]   Fax:  [phone]  Name: Natalie Foster MRN: [id number] Date of Birth: [birth year]

## 2018-08-09 ENCOUNTER — Ambulatory Visit (HOSPITAL_COMMUNITY): Payer: BLUE CROSS/BLUE SHIELD | Admitting: Physical Therapy

## 2018-08-09 DIAGNOSIS — G8929 Other chronic pain: Secondary | ICD-10-CM

## 2018-08-09 DIAGNOSIS — M6281 Muscle weakness (generalized): Secondary | ICD-10-CM

## 2018-08-09 DIAGNOSIS — M5441 Lumbago with sciatica, right side: Secondary | ICD-10-CM | POA: Diagnosis not present

## 2018-08-09 DIAGNOSIS — R29898 Other symptoms and signs involving the musculoskeletal system: Secondary | ICD-10-CM

## 2018-08-09 DIAGNOSIS — R29818 Other symptoms and signs involving the nervous system: Secondary | ICD-10-CM

## 2018-08-09 NOTE — Therapy (Signed)
[facility] [address] [place], [place], [address] Phone: [phone]   Fax:  [phone]  Physical Therapy Treatment  Patient Details  Name: Natalie Foster MRN: [id number] Date of Birth: 10-11-1985 Referring Provider (PT): Lisbeth RenshawNeelesh Nundkumar, MD   Encounter Date: 08/09/2018  PT End of Session - 08/09/18 1207    Visit Number  2    Number of Visits  9    Date for PT Re-Evaluation  09/01/18    Authorization Type  [employer] Other    Authorization Time Period  08/04/18 to 09/01/18    Authorization - Visit Number  2    Authorization - Number of Visits  30   PT/OT/Chiro   PT Start Time  0950    PT Stop Time  1030    PT Time Calculation (min)  40 min    Activity Tolerance  Patient tolerated treatment well;No increased pain    Behavior During Therapy  WFL for tasks assessed/performed       Past Medical History:  Diagnosis Date  . HSP (Henoch-Schonlein purpura) nephritis (HCC)     Past Surgical History:  Procedure Laterality Date  . LAMINOTOMY    . LAPAROSCOPIC BILATERAL SALPINGECTOMY Bilateral 07/07/2018   Procedure: LAPAROSCOPIC BILATERAL SALPINGECTOMY;  Surgeon: Lazaro ArmsEure, Luther H, MD;  Location: [facility];  Service: Gynecology;  Laterality: Bilateral;  . LUMBAR DISC SURGERY    . MOUTH SURGERY    . NO PAST SURGERIES      There were no vitals filed for this visit.  Subjective Assessment - 08/09/18 1019    Subjective  Pt states she only has a little pain down her Rt side.  States its in her lowback and 3/10.  Reports compliance with  HEP.    Currently in Pain?  Yes    Pain Score  3     Pain Location  Back    Pain Orientation  Right;Lower    Pain Descriptors / Indicators  Aching;Tightness;Tingling         [facility] PT Assessment - 08/09/18 0001      Assessment   Medical Diagnosis  disc displacement, lumbar       Observation/Other Assessments   Focus on Therapeutic Outcomes (FOTO)   Functional status:  55.73                    [facility] Adult PT Treatment/Exercise - 08/09/18 0001      Lumbar Exercises: Stretches   Active Hamstring Stretch  Right;Left;3 reps;30 seconds    Active Hamstring Stretch Limitations  instructed in supine, long sitting and standing    Single Knee to Chest Stretch  3 reps;30 seconds    Single Knee to Chest Stretch Limitations  reviewed HEP    Double Knee to Chest Stretch  2 reps;30 seconds    Double Knee to Chest Stretch Limitations  reviewed HEP    Piriformis Stretch  Right;Left;2 reps;30 seconds;Other (comment)    Piriformis Stretch Limitations  shown in seated and reveiwed in supine      Lumbar Exercises: Supine   Ab Set  10 reps;5 seconds    Bridge  10 reps    Bridge Limitations  5" holds             PT Education - 08/09/18 1206    Education Details  reviewed goals, HEP and POC moving forward.  Completed FOTO     Person(s) Educated  Patient    Methods  Explanation  Comprehension  Verbalized understanding       PT Short Term Goals - 08/09/18 1024      PT SHORT TERM GOAL #1   Title  Pt will have improved MMT by 1/2 grade in order to reduce LBP and RLE radicular pain.    Time  2    Period  Weeks    Status  On-going      PT SHORT TERM GOAL #2   Title  Pt will have improved lumbar AROM to [facility] in order to demo reduced soft tissue restriction and minimize her overall LBP.     Time  2    Period  Weeks    Status  On-going        PT Long Term Goals - 08/09/18 1025      PT LONG TERM GOAL #1   Title  Pt will have improved MMT by 1 grade in order to further reduce LBP and maximize return to PLOF.     Time  4    Period  Weeks    Status  On-going      PT LONG TERM GOAL #2   Title  Pt will report being able to sit and stand for 45 mins or longer in order to allow her to prepare a small meal and enjoy it with her family with greater ease.     Time  4    Period  Weeks    Status  On-going      PT LONG TERM GOAL #3   Title  Pt will have  negative SLR and slump testing on the RLE to demo reduced nerve irritation and reduce her RLE radicular/referred pain.     Time  4    Period  Weeks    Status  On-going      PT LONG TERM GOAL #4   Title  Pt will have reduced pain to palpation throughout lumbar and hip mm to demo reduced restrictions in order to decrease her overall pain.     Time  4    Period  Weeks    Status  On-going            Plan - 08/09/18 1207    Clinical Impression Statement  Reviewed goals, HEP and POC moving forward.  completed FOTO and explained measurement data usage.  PT given alternate ways of completing piriformis stretch and additional hamsting stretch this session.  Pt able to complete all without pain but wiht more tightness in Rt as comparted to Lt.  Hamstring stretch added to HEP this session.     Rehab Potential  Good    PT Frequency  2x / week    PT Duration  4 weeks    PT Treatment/Interventions  ADLs/Self Care Home Management;Aquatic Therapy;Cryotherapy;Electrical Stimulation;Moist Heat;Ultrasound;DME Instruction;Gait training;Stair training;Functional mobility training;Therapeutic activities;Therapeutic exercise;Balance training;Neuromuscular re-education;Patient/family education;Manual techniques;Scar mobilization;Passive range of motion;Dry needling;Spinal Manipulations;Joint Manipulations    PT Next Visit Plan  Continue with  BLE/hip stretching, sciatic nerve glides, core, BLE, functional strengthening, dynamic balance training, manual for joint mobility and soft tissue restrictions.      PT Home Exercise Plan  eval: supine piriformis stretch, SKTC, DKTC  12/23: hamstring stretch    Consulted and Agree with Plan of Care  Patient       Patient will benefit from skilled therapeutic intervention in order to improve the following deficits and impairments:  Decreased activity tolerance, Decreased endurance, Decreased range of motion, Decreased scar mobility, Decreased strength, Hypomobility,  Increased fascial restricitons, Increased muscle spasms, Impaired flexibility, Improper body mechanics, Postural dysfunction, Pain  Visit Diagnosis: Chronic right-sided low back pain with right-sided sciatica  Muscle weakness (generalized)  Other symptoms and signs involving the musculoskeletal system  Other symptoms and signs involving the nervous system     Problem List Patient Active Problem List   Diagnosis Date Noted  . Abdominal pain, RLQ (right lower quadrant) 05/11/2018   Lurena NidaAmy B Varonica Siharath, PTA/CLT [phone]  Lurena NidaFrazier, Leahanna Buser B 08/09/2018, 12:11 PM  [place] [facility] [address] [place], [place], [address] Phone: [phone]   Fax:  [phone]  Name: Natalie Foster MRN: [id number] Date of Birth: 1985/12/06

## 2018-08-13 ENCOUNTER — Encounter

## 2018-08-16 ENCOUNTER — Encounter (HOSPITAL_COMMUNITY): Payer: BLUE CROSS/BLUE SHIELD

## 2018-08-20 ENCOUNTER — Encounter

## 2018-08-20 ENCOUNTER — Encounter (HOSPITAL_COMMUNITY): Payer: BLUE CROSS/BLUE SHIELD

## 2018-08-24 ENCOUNTER — Encounter (HOSPITAL_COMMUNITY): Payer: BLUE CROSS/BLUE SHIELD

## 2018-08-26 ENCOUNTER — Encounter (HOSPITAL_COMMUNITY): Payer: BLUE CROSS/BLUE SHIELD

## 2018-08-31 ENCOUNTER — Ambulatory Visit (HOSPITAL_COMMUNITY): Payer: Self-pay | Attending: Neurosurgery

## 2018-08-31 ENCOUNTER — Encounter (HOSPITAL_COMMUNITY): Payer: Self-pay

## 2018-08-31 DIAGNOSIS — R29898 Other symptoms and signs involving the musculoskeletal system: Secondary | ICD-10-CM | POA: Insufficient documentation

## 2018-08-31 DIAGNOSIS — M6281 Muscle weakness (generalized): Secondary | ICD-10-CM | POA: Insufficient documentation

## 2018-08-31 DIAGNOSIS — R29818 Other symptoms and signs involving the nervous system: Secondary | ICD-10-CM | POA: Insufficient documentation

## 2018-08-31 DIAGNOSIS — G8929 Other chronic pain: Secondary | ICD-10-CM | POA: Insufficient documentation

## 2018-08-31 DIAGNOSIS — M5441 Lumbago with sciatica, right side: Secondary | ICD-10-CM | POA: Insufficient documentation

## 2018-08-31 NOTE — Therapy (Signed)
[facility] [address] [place], [place], [address] Phone: [phone]   Fax:  [phone]  Physical Therapy Treatment  Patient Details  Name: Natalie Foster MRN: [id number] Date of Birth: 30-Sep-1985 Referring Provider (PT): Lisbeth RenshawNeelesh Nundkumar, MD   Encounter Date: 08/31/2018  PT End of Session - 08/31/18 1656    Visit Number  3    Number of Visits  9    Date for PT Re-Evaluation  09/01/18    Authorization Type  BCBS Other    Authorization - Visit Number  3    Authorization - Number of Visits  30   PT/OT/Chiro   PT Start Time  1649    PT Stop Time  1735    PT Time Calculation (min)  46 min    Activity Tolerance  Patient tolerated treatment well;No increased pain    Behavior During Therapy  WFL for tasks assessed/performed       Past Medical History:  Diagnosis Date  . HSP (Henoch-Schonlein purpura) nephritis (HCC)     Past Surgical History:  Procedure Laterality Date  . LAMINOTOMY    . LAPAROSCOPIC BILATERAL SALPINGECTOMY Bilateral 07/07/2018   Procedure: LAPAROSCOPIC BILATERAL SALPINGECTOMY;  Surgeon: Lazaro ArmsEure, Luther H, MD;  Location: [facility];  Service: Gynecology;  Laterality: Bilateral;  . LUMBAR DISC SURGERY    . MOUTH SURGERY    . NO PAST SURGERIES      There were no vitals filed for this visit.  Subjective Assessment - 08/31/18 1651    Subjective  Pt reports her back feels good, no reoprts of pain currently.  Reports no radicular symptoms for the last 4 days.  Did receive shot in side for varicose veins that is a little sore.  Reports compliance wiht HEP at least every other day.  Reports change in insurance, unsure if change will allow her to continue therapy or not, plans to continue research tonight.    Patient Stated Goals  manage this pain, figure out what is causing it    Currently in Pain?  No/denies                       [facility] - 08/31/18 0001      Lumbar Exercises:  Stretches   Active Hamstring Stretch  Right;Left;3 reps;30 seconds    Active Hamstring Stretch Limitations  supine    Piriformis Stretch  Right;Left;2 reps;30 seconds;Other (comment)    Piriformis Stretch Limitations  shown in seated and reveiwed in supine      Lumbar Exercises: Supine   Ab Set  10 reps;5 seconds    AB Set Limitations  verbal/tactile cueing    Clam  5 reps;3 seconds    Bent Knee Raise  10 reps;3 seconds    Bent Knee Raise Limitations  with ab set    Bridge  10 reps    Bridge Limitations  5" holds    Straight Leg Raise  10 reps    Straight Leg Raises Limitations  with ab set      Manual Therapy   Manual Therapy  Soft tissue mobilization    Manual therapy comments  Manual complete separate than rest of tx    Soft tissue mobilization  Prone: Lumbar paraspinals, QL and gluteal mm               PT Short Term Goals - 08/09/18 1024      PT SHORT TERM GOAL #1  Title  Pt will have improved MMT by 1/2 grade in order to reduce LBP and RLE radicular pain.    Time  2    Period  Weeks    Status  On-going      PT SHORT TERM GOAL #2   Title  Pt will have improved lumbar [facility] in order to demo reduced soft tissue restriction and minimize her overall LBP.     Time  2    Period  Weeks    Status  On-going        PT Long Term Goals - 08/09/18 1025      PT LONG TERM GOAL #1   Title  Pt will have improved MMT by 1 grade in order to further reduce LBP and maximize return to PLOF.     Time  4    Period  Weeks    Status  On-going      PT LONG TERM GOAL #2   Title  Pt will report being able to sit and stand for 45 mins or longer in order to allow her to prepare a small meal and enjoy it with her family with greater ease.     Time  4    Period  Weeks    Status  On-going      PT LONG TERM GOAL #3   Title  Pt will have negative SLR and slump testing on the RLE to demo reduced nerve irritation and reduce her RLE radicular/referred pain.     Time  4    Period   Weeks    Status  On-going      PT LONG TERM GOAL #4   Title  Pt will have reduced pain to palpation throughout lumbar and hip mm to demo reduced restrictions in order to decrease her overall pain.     Time  4    Period  Weeks    Status  On-going            Plan - 08/31/18 1739    Clinical Impression Statement  Session focus on lumbar mobility and core/proximal strengthening.  Pt with difficulty with abdominal activation, added exercises to improve awareness of contraction for strengthening as well as stability exercises.  No reports of pain through session.  Noted significant tightness with hamstrings with Rt>Lt.  EOS with manual soft tissue mobilization to lumbar and gluteal musculature.  Pt with palpated pain over Rt PSIS.  Some tightness noted Rt glut over Lt.  Pt given additional HEP for core activation as well as list of massage therapists in area in case she is unable to return to PT with change in insurance plan.      Rehab Potential  Good    PT Frequency  2x / week    PT Duration  4 weeks    PT Treatment/Interventions  ADLs/Self Care Home Management;Aquatic Therapy;Cryotherapy;Electrical Stimulation;Moist Heat;Ultrasound;DME Instruction;Gait training;Stair training;Functional mobility training;Therapeutic activities;Therapeutic exercise;Balance training;Neuromuscular re-education;Patient/family education;Manual techniques;Scar mobilization;Passive range of motion;Dry needling;Spinal Manipulations;Joint Manipulations    PT Next Visit Plan  Review complaince wiht HEP.  Next session add 3D hip excursion, lunges and palloff exercises.  Continue with  BLE/hip stretching, sciatic nerve glides, core, BLE, functional strengthening, dynamic balance training, manual for joint mobility and soft tissue restrictions.      PT Home Exercise Plan  eval: supine piriformis stretch, SKTC, DKTC  12/23: hamstring stretch; 1/14: bridge, ab set and bent knee raise with ab set  Patient will benefit  from skilled therapeutic intervention in order to improve the following deficits and impairments:  Decreased activity tolerance, Decreased endurance, Decreased range of motion, Decreased scar mobility, Decreased strength, Hypomobility, Increased fascial restricitons, Increased muscle spasms, Impaired flexibility, Improper body mechanics, Postural dysfunction, Pain  Visit Diagnosis: Chronic right-sided low back pain with right-sided sciatica  Muscle weakness (generalized)  Other symptoms and signs involving the musculoskeletal system  Other symptoms and signs involving the nervous system     Problem List Patient Active Problem List   Diagnosis Date Noted  . Abdominal pain, RLQ (right lower quadrant) 05/11/2018   Becky Sax, LPTA; CBIS [phone]  Juel Burrow 08/31/2018, 5:55 PM  [facility] [address] [place], [place], [address] Phone: [phone]   Fax:  [phone]  Name: Natalie Foster MRN: [id number] Date of Birth: [birth year]

## 2018-08-31 NOTE — Patient Instructions (Signed)
Bracing With Leg March (Hook-Lying)    With neutral spine, tighten pelvic floor and abdominals and hold. Alternating legs, lift foot 12  inches and return to floor. Repeat 10 times. Do 1-2 times a day.  Copyright  VHI. All rights reserved.   Isometric Abdominal    Lying on back with knees bent, tighten stomach by pressing elbows down. Hold 5 seconds. Repeat 10 times per set. Do 1-2 sets per session. Do 2 sessions per day.  http://orth.exer.us/1087   Copyright  VHI. All rights reserved.

## 2018-09-01 ENCOUNTER — Telehealth (HOSPITAL_COMMUNITY): Payer: Self-pay | Admitting: Occupational Therapy

## 2018-09-01 NOTE — Telephone Encounter (Signed)
Pt called back request to cx this apptment until she can get Insurance Cleared up. Self-Pay   L/m  BCBS expired 08/18/2019. Spoke to Rep Candise BowensJen Ref# [id number] NF 08/31/2018.

## 2018-09-02 ENCOUNTER — Ambulatory Visit (HOSPITAL_COMMUNITY): Payer: Self-pay | Admitting: Physical Therapy

## 2018-09-06 ENCOUNTER — Telehealth (HOSPITAL_COMMUNITY): Payer: Self-pay

## 2018-09-06 NOTE — Telephone Encounter (Signed)
She is waiting on Ins Cards to arrive she will call us back to r/s -pt request to be put on hold. NF 08/2018

## 2018-09-07 ENCOUNTER — Ambulatory Visit (HOSPITAL_COMMUNITY): Payer: Self-pay

## 2018-11-11 ENCOUNTER — Encounter (HOSPITAL_COMMUNITY): Payer: Self-pay

## 2018-11-11 NOTE — Therapy (Signed)
[facility] [address] [place], [place], [address] Phone: [phone]   Fax:  [phone]  Patient Details  Name: Natalie Foster MRN: [id number] Date of Birth: 10/26/1985 Referring Provider:  No ref. provider found  Encounter Date: 11/11/2018  PHYSICAL THERAPY DISCHARGE SUMMARY  Visits from Start of Care: 3  Current functional level related to goals / functional outcomes: See last note   Remaining deficits: See last note   Education / Equipment: N/a - see last note  Plan: Patient agrees to discharge.  Patient goals were not met. Patient is being discharged due to not returning since the last visit.  ?????     Brooke Powell PT, DPT  [place] [facility] [address] [place], , [address] Phone: [phone]   Fax:  [phone] 

## 2019-11-10 ENCOUNTER — Ambulatory Visit: Payer: Self-pay | Attending: Family

## 2019-11-10 DIAGNOSIS — Z23 Encounter for immunization: Secondary | ICD-10-CM

## 2019-11-10 NOTE — Progress Notes (Signed)
   Covid-19 Vaccination Clinic  Name:  Natalie Foster    MRN: [id number] DOB: [birth year]  11/10/2019  [name] was observed post Covid-19 immunization for 15 minutes without incident. She was provided with Vaccine Information Sheet and instruction to access the V-Safe system.   [name] was instructed to call 911 with any severe reactions post vaccine: Marland Kitchen Difficulty breathing  . Swelling of face and throat  . A fast heartbeat  . A bad rash all over body  . Dizziness and weakness   Immunizations Administered    Name Date Dose VIS Date Route   Moderna COVID-19 Vaccine 11/10/2019  3:54 PM 0.5 mL 07/19/2019 Intramuscular   Manufacturer: Moderna   Lot: [id number]   NDC: 12258-346-21

## 2019-12-05 ENCOUNTER — Other Ambulatory Visit: Payer: Self-pay | Admitting: Family Medicine

## 2019-12-05 DIAGNOSIS — R1900 Intra-abdominal and pelvic swelling, mass and lump, unspecified site: Secondary | ICD-10-CM

## 2019-12-12 ENCOUNTER — Ambulatory Visit
Admission: RE | Admit: 2019-12-12 | Discharge: 2019-12-12 | Disposition: A | Discharge status: Home / Self Care | Payer: 59 | Source: Ambulatory Visit | Attending: Family Medicine | Admitting: Family Medicine

## 2019-12-12 ENCOUNTER — Other Ambulatory Visit: Payer: Self-pay

## 2019-12-12 DIAGNOSIS — R1900 Intra-abdominal and pelvic swelling, mass and lump, unspecified site: Secondary | ICD-10-CM

## 2019-12-13 ENCOUNTER — Ambulatory Visit: Payer: 59 | Attending: Family

## 2019-12-13 DIAGNOSIS — Z23 Encounter for immunization: Secondary | ICD-10-CM

## 2019-12-13 NOTE — Progress Notes (Signed)
   Covid-19 Vaccination Clinic  Name:  Natalie Foster    MRN: [id number] DOB: 08/18/1986  12/13/2019  Ms. Shimmin was observed post Covid-19 immunization for 15 minutes without incident. She was provided with Vaccine Information Sheet and instruction to access the V-Safe system.   Ms. Bills was instructed to call 911 with any severe reactions post vaccine: Marland Kitchen Difficulty breathing  . Swelling of face and throat  . A fast heartbeat  . A bad rash all over body  . Dizziness and weakness   Immunizations Administered    Name Date Dose VIS Date Route   Moderna COVID-19 Vaccine 12/13/2019  2:58 PM 0.5 mL 07/2019 Intramuscular   Manufacturer: Moderna   Lot: [id number]   NDC: 26834-196-22

## 2019-12-15 ENCOUNTER — Other Ambulatory Visit: Payer: Self-pay

## 2020-05-11 ENCOUNTER — Other Ambulatory Visit: Payer: Self-pay

## 2020-05-11 ENCOUNTER — Other Ambulatory Visit: Payer: 59

## 2020-05-11 DIAGNOSIS — Z20822 Contact with and (suspected) exposure to covid-19: Secondary | ICD-10-CM

## 2020-05-12 LAB — SARS-COV-2, NAA 2 DAY TAT

## 2020-05-12 LAB — NOVEL CORONAVIRUS, NAA: SARS-CoV-2, NAA: NOT DETECTED

## 2021-09-25 DIAGNOSIS — R635 Abnormal weight gain: Secondary | ICD-10-CM | POA: Diagnosis not present

## 2021-09-25 DIAGNOSIS — Z7182 Exercise counseling: Secondary | ICD-10-CM | POA: Diagnosis not present

## 2021-09-25 DIAGNOSIS — R69 Illness, unspecified: Secondary | ICD-10-CM | POA: Diagnosis not present

## 2021-09-25 DIAGNOSIS — Z8349 Family history of other endocrine, nutritional and metabolic diseases: Secondary | ICD-10-CM | POA: Diagnosis not present

## 2021-09-25 DIAGNOSIS — Z6832 Body mass index (BMI) 32.0-32.9, adult: Secondary | ICD-10-CM | POA: Diagnosis not present

## 2021-09-25 DIAGNOSIS — Z713 Dietary counseling and surveillance: Secondary | ICD-10-CM | POA: Diagnosis not present

## 2021-09-25 DIAGNOSIS — R5383 Other fatigue: Secondary | ICD-10-CM | POA: Diagnosis not present

## 2021-09-27 LAB — COMPREHENSIVE METABOLIC PANEL: Calcium: 9.6 (ref 8.7–10.7)

## 2021-09-27 LAB — BASIC METABOLIC PANEL
BUN: 11 (ref 4–21)
Creatinine: 0.7 (ref 0.5–1.1)

## 2021-09-27 LAB — HEPATIC FUNCTION PANEL
ALT: 26 U/L (ref 7–35)
AST: 22 (ref 13–35)

## 2021-09-27 LAB — LIPID PANEL: HDL: 74 — AB (ref 35–70)

## 2021-10-02 LAB — HEMOGLOBIN A1C: Hemoglobin A1C: 4.9

## 2021-10-02 LAB — TSH: TSH: 1.05 (ref 0.41–5.90)

## 2021-10-08 ENCOUNTER — Telehealth: Payer: Self-pay | Admitting: "Endocrinology

## 2021-10-08 NOTE — Telephone Encounter (Signed)
Called pt for referral on weight gain. Dr Dorris Fetch said to accept. Patient's VM is full. Referral in [place]

## 2021-11-11 ENCOUNTER — Other Ambulatory Visit: Payer: Self-pay

## 2021-11-11 ENCOUNTER — Ambulatory Visit: Payer: 59 | Admitting: "Endocrinology

## 2021-11-11 ENCOUNTER — Encounter: Payer: Self-pay | Admitting: "Endocrinology

## 2021-11-11 VITALS — BP 110/82 | HR 76 | Ht 66.0 in | Wt 210.0 lb

## 2021-11-11 DIAGNOSIS — E6609 Other obesity due to excess calories: Secondary | ICD-10-CM

## 2021-11-11 DIAGNOSIS — E782 Mixed hyperlipidemia: Secondary | ICD-10-CM | POA: Diagnosis not present

## 2021-11-11 DIAGNOSIS — Z6833 Body mass index (BMI) 33.0-33.9, adult: Secondary | ICD-10-CM | POA: Diagnosis not present

## 2021-11-11 NOTE — Patient Instructions (Signed)

## 2021-11-11 NOTE — Progress Notes (Signed)
? ?    Endocrinology Consult Note ?                                           11/11/2021, 12:41 PM ? ? ?Subjective:  ? ? Patient ID: Natalie Foster, female    DOB: March 03, 1986, PCP Bucio, Julian ReilElsa C, FNP ? ? ?Past Medical History:  ?Diagnosis Date  ? HSP (Henoch-Schonlein purpura) nephritis (HCC)   ? ?Past Surgical History:  ?Procedure Laterality Date  ? LAMINOTOMY    ? LAPAROSCOPIC BILATERAL SALPINGECTOMY Bilateral 07/07/2018  ? Procedure: LAPAROSCOPIC BILATERAL SALPINGECTOMY;  Surgeon: Lazaro ArmsEure, Luther H, MD;  Location: [facility];  Service: Gynecology;  Laterality: Bilateral;  ? LUMBAR DISC SURGERY    ? MOUTH SURGERY    ? NO PAST SURGERIES    ? ?Social History  ? ?Socioeconomic History  ? Marital status: Married  ?  Spouse name: Not on file  ? Number of children: Not on file  ? Years of education: Not on file  ? Highest education level: Not on file  ?Occupational History  ? Not on file  ?Tobacco Use  ? Smoking status: Former  ?  Packs/day: 0.25  ?  Years: 3.00  ?  Pack years: 0.75  ?  Types: Cigarettes  ?  Quit date: 07/01/1988  ?  Years since quitting: 33.3  ? Smokeless tobacco: Never  ? Tobacco comments:  ?  social smoker in college.  ?Vaping Use  ? Vaping Use: Never used  ?Substance and Sexual Activity  ? Alcohol use: Yes  ?  Comment: once a week  ? Drug use: No  ? Sexual activity: Not Currently  ?Other Topics Concern  ? Not on file  ?Social History Narrative  ? Not on file  ? ?Social Determinants of Health  ? ?Financial Resource Strain: Not on file  ?Food Insecurity: Not on file  ?Transportation Needs: Not on file  ?Physical Activity: Not on file  ?Stress: Not on file  ?Social Connections: Not on file  ? ?Family History  ?Problem Relation Age of Onset  ? Cancer Mother   ? Hypertension Mother   ? Thyroid disease Mother   ? Cancer Father   ? Hyperlipidemia Father   ? Breast cancer Paternal Aunt   ? Breast cancer Paternal Aunt   ? Breast cancer Paternal Aunt   ? Cancer Paternal Grandmother   ?     lung  ? Stroke Paternal  Grandfather   ? Cancer Paternal Grandfather   ?     lung  ? ?Outpatient Encounter Medications as of 11/11/2021  ?Medication Sig  ? MAGNESIUM-OXIDE PO Take 1 tablet by mouth daily in the afternoon.  ? ALPRAZolam (XANAX) 0.25 MG tablet Take 1 tablet by mouth 3 (three) times daily as needed.  ? escitalopram (LEXAPRO) 20 MG tablet Take 20 mg by mouth daily.  ? Melatonin 10 MG TABS Take 10 mg by mouth at bedtime.  ? Multiple Vitamin (MULTIVITAMIN WITH MINERALS) TABS tablet Take 1 tablet by mouth daily.  ? SUMAtriptan (IMITREX) 100 MG tablet Take by mouth as needed.  ? Zinc Acetate, Oral, (ZINC ACETATE PO) Take 10 mg by mouth daily as needed.  ? [DISCONTINUED] escitalopram (LEXAPRO) 5 MG tablet Take 5 mg by mouth daily.   ? [DISCONTINUED] HYDROcodone-acetaminophen (NORCO/VICODIN) 5-325 MG tablet Take 1 tablet by mouth every 6 (six) hours as needed. (Patient  not taking: Reported on 07/14/2018)  ? [DISCONTINUED] ketorolac (TORADOL) 10 MG tablet Take 1 tablet (10 mg total) by mouth every 8 (eight) hours as needed. (Patient not taking: Reported on 07/14/2018)  ? ?No facility-administered encounter medications on file as of 11/11/2021.  ? ?ALLERGIES: ?Allergies  ?Allergen Reactions  ? Cortisone Nausea And Vomiting  ? Pineapple Hives and Itching  ? ? ?VACCINATION STATUS: ?Immunization History  ?Administered Date(s) Administered  ? Moderna Sars-Covid-2 Vaccination 11/10/2019, 12/13/2019  ? Rho (D) Immune Globulin 10/28/2012  ? Tdap 10/29/2012  ? ? ?HPI ?Natalie Foster is 36 y.o. female who presents today with a medical history as above. she is being seen in consultation for weight management requested by Bucio, Julian Reil, FNP.  ? ?History is obtained directly from the patient as well as chart review.  She does not have previously diagnosed endocrine dysfunction.  She does have hyperlipidemia not on treatment.  She has been fighting overweight/class I obesity for several years.  She has noticed over the last several months-years her  weight has been progressively increasing.  She has tried different diet programs including keto diet without significant success.  She did have previsit labs showing A1c of 4.9% indicating absence of prediabetes.  She does however have significant dyslipidemia with LDL of 131, total cholesterol 225, triglycerides 69. ?She has 2 children in teenagers.  She is not planning for any further pregnancy.  She reports significant family history of thyroid dysfunction as well as diabetes. ? ?She is on Lexapro for mild mood disorder.  She is an active smoker, quit in 1989. ? ?Review of Systems ? ?Constitutional: + Grossly weight gain, -fatigue, no subjective hyperthermia, no subjective hypothermia ?Eyes: no blurry vision, no xerophthalmia ?ENT: no sore throat, no nodules palpated in throat, no dysphagia/odynophagia, no hoarseness ?Cardiovascular: no Chest Pain, no Shortness of Breath, no palpitations, no leg swelling ?Respiratory: no cough, no shortness of breath ?Gastrointestinal: no Nausea/Vomiting/Diarhhea ?Musculoskeletal: no muscle/joint aches ?Skin: no rashes ?Neurological: no tremors, no numbness, no tingling, no dizziness ?Psychiatric: no depression, no anxiety ? ?Objective:  ?  ? ?  11/11/2021  ?  9:32 AM 07/14/2018  ? 10:30 AM 07/07/2018  ?  2:26 PM  ?Vitals with BMI  ?Height 5\' 6"  5\' 6"    ?Weight 210 lbs 195 lbs   ?BMI 33.91 31.49   ?Systolic 110 111  ?Diastolic 82 70 74  ?Pulse 76 84 71  ? ? ?BP 110/82   Pulse 76   Ht 5\' 6"  (1.676 m)   Wt 210 lb (95.3 kg)   BMI 33.89 kg/m?   ?Wt Readings from Last 3 Encounters:  ?11/11/21 210 lb (95.3 kg)  ?07/14/18 195 lb (88.5 kg)  ?07/01/18 189 lb (85.7 kg)  ?  ?Physical Exam ? ?Constitutional:  Body mass index is 33.89 kg/m?.,  not in acute distress, normal state of mind ?Eyes: PERRLA, EOMI, no exophthalmos ?ENT: moist mucous membranes, no gross thyromegaly, no gross cervical lymphadenopathy ?Cardiovascular: normal precordial activity, Regular Rate and Rhythm, no  Murmur/Rubs/Gallops ?Respiratory:  adequate breathing efforts, no gross chest deformity, Clear to auscultation bilaterally ?Gastrointestinal: abdomen soft, Non -tender, No distension, Bowel Sounds present, no gross organomegaly ?Musculoskeletal: no gross deformities, strength intact in all four extremities ?Skin: moist, warm, no rashes ?Neurological: no tremor with outstretched hands, Deep tendon reflexes normal in bilateral lower extremities. ? ?CMP ( most recent) ?CMP  ?   ?Component Value Date/Time  ? NA 137 07/01/2018 0830  ? K 3.6 07/01/2018  0830  ? CL 106 07/01/2018 0830  ? CO2 25 07/01/2018 0830  ? GLUCOSE 84 07/01/2018 0830  ? BUN 11 09/27/2021 0000  ? CREATININE 0.7 09/27/2021 0000  ? CREATININE 0.52 07/01/2018 0830  ? CALCIUM 9.6 09/27/2021 0000  ? PROT 7.5 07/01/2018 0830  ? ALBUMIN 4.0 07/01/2018 0830  ? AST 22 09/27/2021 0000  ? ALT 26 09/27/2021 0000  ? ALKPHOS 58 07/01/2018 0830  ? BILITOT 0.6 07/01/2018 0830  ? GFRNONAA >60 07/01/2018 0830  ? GFRAA >60 07/01/2018 0830  ? ?  ? ?Lab Results  ?Component Value Date  ? TSH 1.05 09/27/2021  ? TSH 0.400 03/17/2012  ?  ?Recent Results (from the past 2160 hour(s))  ?Basic metabolic panel     Status: None  ? Collection Time: 09/27/21 12:00 AM  ?Result Value Ref Range  ? BUN 11 4 - 21  ? Creatinine 0.7 0.5 - 1.1  ?Comprehensive metabolic panel     Status: None  ? Collection Time: 09/27/21 12:00 AM  ?Result Value Ref Range  ? Calcium 9.6 8.7 - 10.7  ?Lipid panel     Status: Abnormal  ? Collection Time: 09/27/21 12:00 AM  ?Result Value Ref Range  ? Triglycerides 69 40 - 160  ? Cholesterol 225 (A) 0 - 200  ? HDL 74 (A) 35 - 70  ? LDL Cholesterol 135   ?Hepatic function panel     Status: None  ? Collection Time: 09/27/21 12:00 AM  ?Result Value Ref Range  ? ALT 26 7 - 35 U/L  ? AST 22 13 - 35  ?Hemoglobin A1c     Status: None  ? Collection Time: 09/27/21 12:00 AM  ?Result Value Ref Range  ? Hemoglobin A1C 4.9   ?TSH     Status: None  ? Collection Time: 09/27/21  12:00 AM  ?Result Value Ref Range  ? TSH 1.05 0.41 - 5.90  ?  Comment: total t4 7.3  ? ? ? ?Assessment & Plan:  ? ?1. Class 1 obesity due to excess calories without serious comorbidity with body mass index (BMI)

## 2021-11-18 IMAGING — MG DIGITAL DIAGNOSTIC BILAT W/ TOMO W/ CAD
6 of 10 series · 6 of 30 positions shown · non-contrast
Comparison: Baseline exam

CLINICAL DATA: Patient found a lump in the LEFT breast, 2 o'clock
location. Her physician palpates a mass in the 11 o'clock location
of the RIGHT breast.

EXAM:
DIGITAL DIAGNOSTIC BILATERAL MAMMOGRAM WITH CAD AND TOMO
ULTRASOUND BILATERAL BREAST

[L TAN synth-2D]
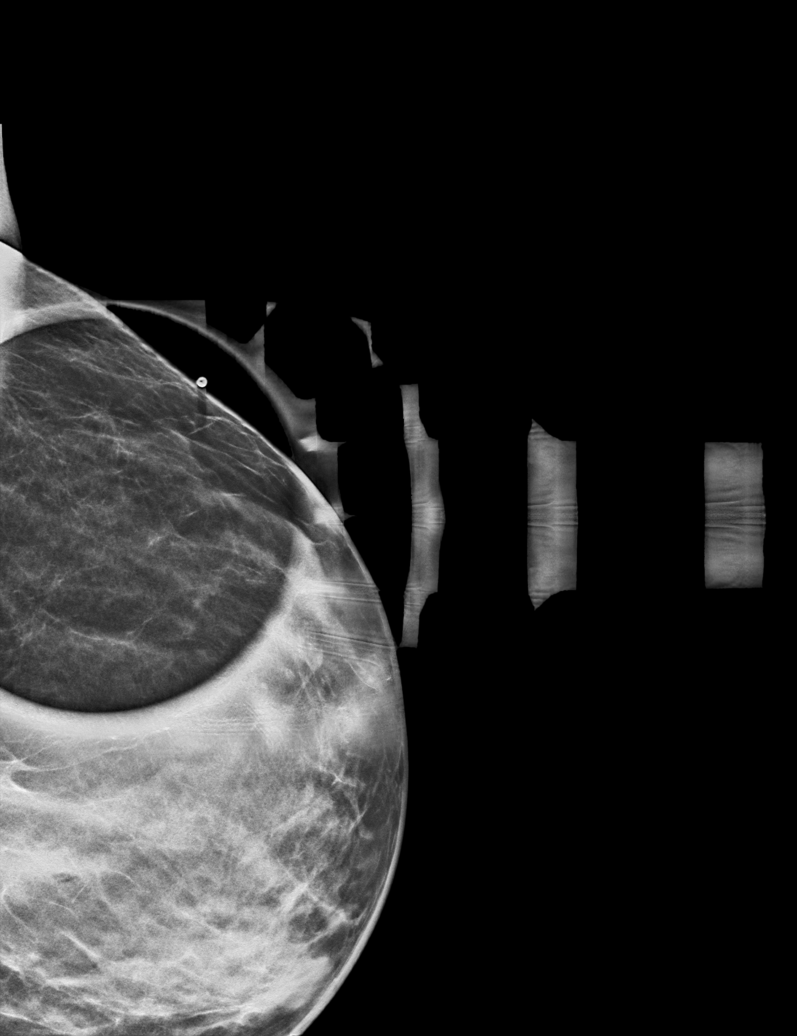

[R MLO synth-2D]
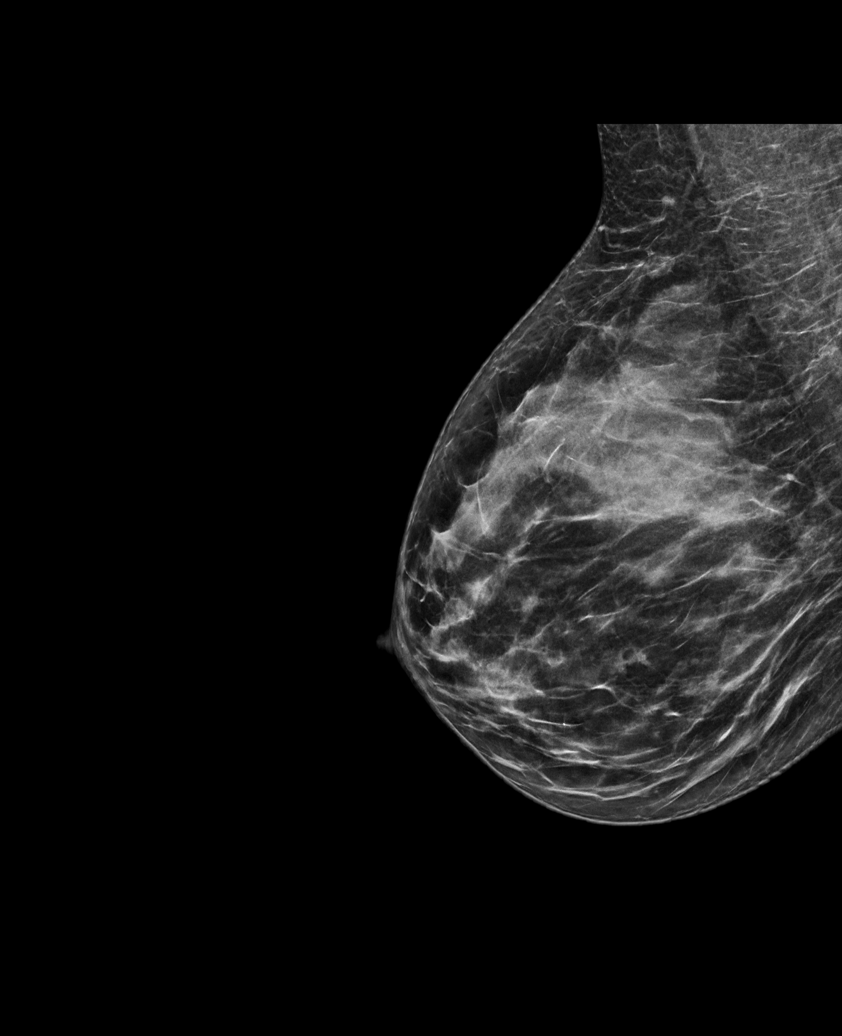

[L CC synth-2D]
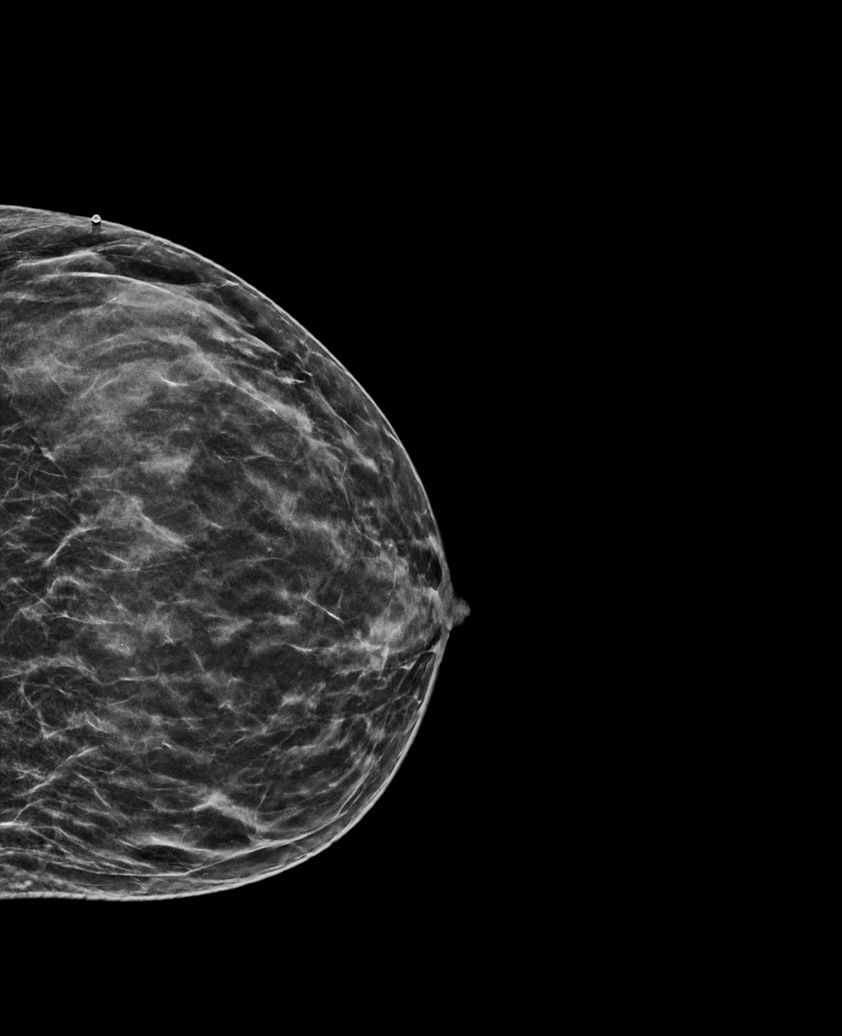

[R CC synth-2D]
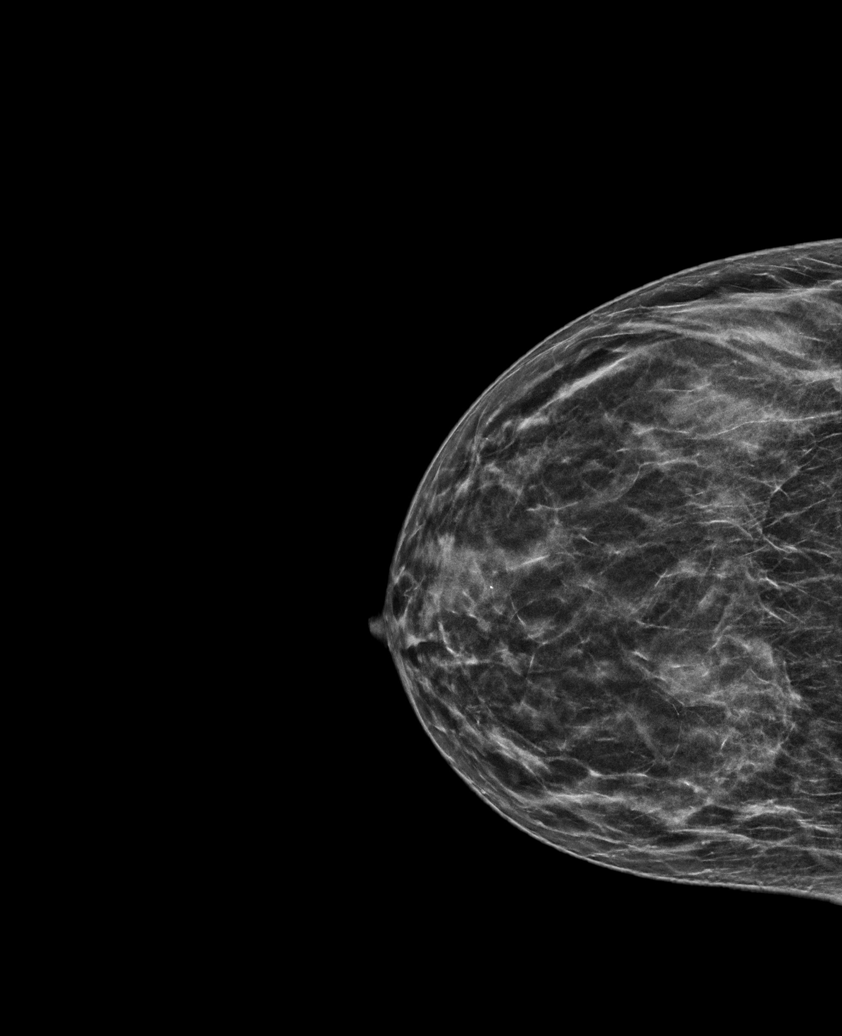

[L MLO synth-2D]
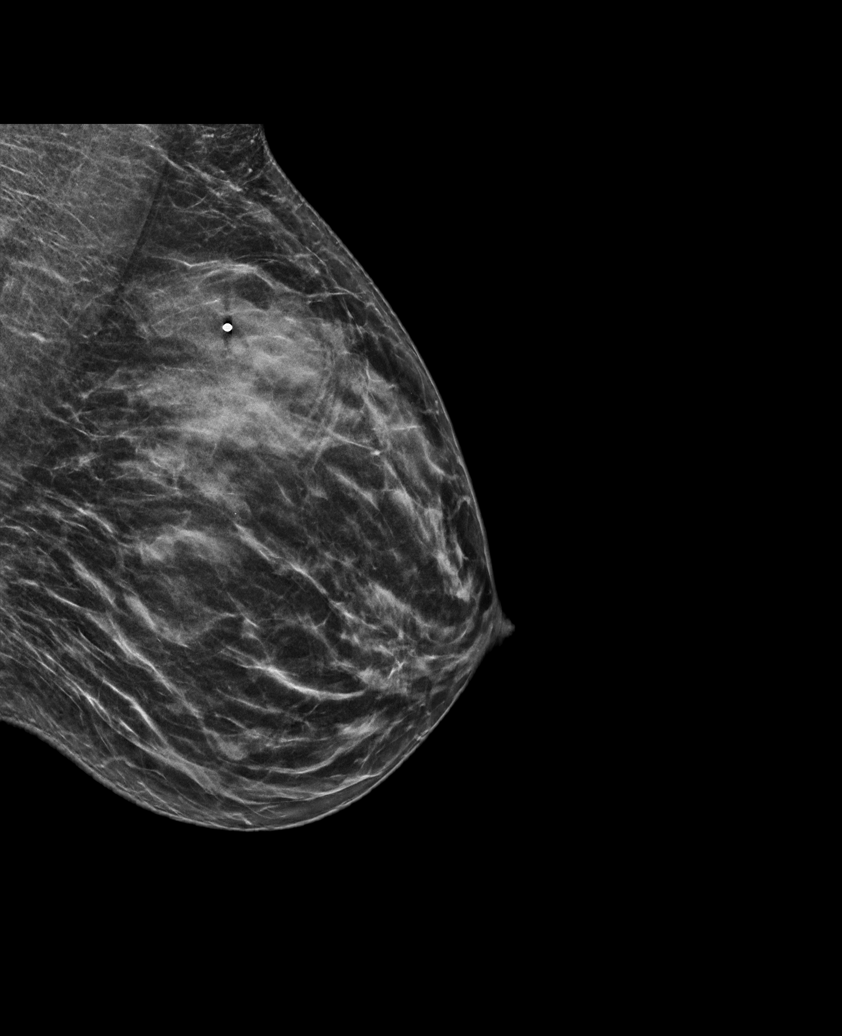

[R MLO tomo · tomo slice 31/61.0]
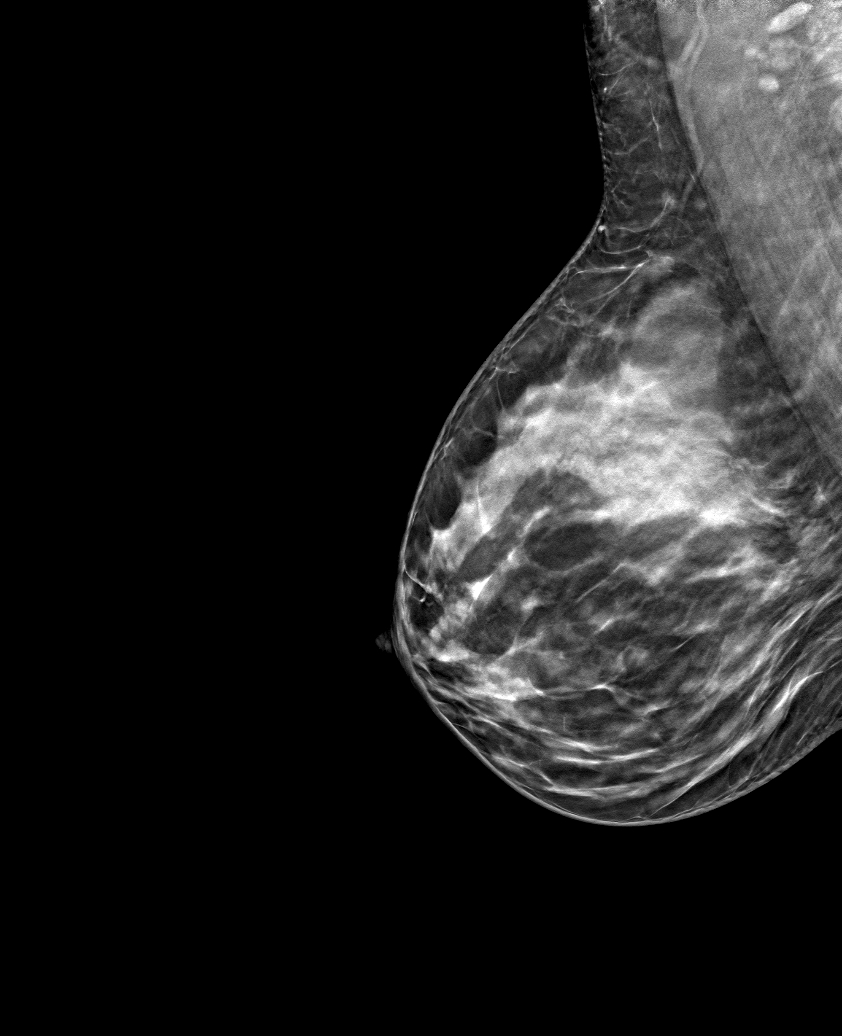

[6 of 30 positions shown; findings below may reference images not displayed]

ACR Breast Density Category c: The breast tissue is heterogeneously
dense, which may obscure small masses.
FINDINGS: Right breast:

Mammogram: There is asymmetry in the UPPER-OUTER QUADRANT of the
RIGHT breast not associated with distortion or definitive mass.

Mammographic images were processed with CAD.

Ultrasound: Targeted ultrasound is performed, showing normal
appearing dense fibroglandular tissue in the UPPER-OUTER QUADRANT of
the RIGHT breast. No suspicious mass, distortion, or acoustic
shadowing is demonstrated with ultrasound.

Left breast:

Mammogram: No suspicious mass, distortion, or microcalcifications
are identified to suggest presence of malignancy. Spot tangential
view is performed in the area concern in the UPPER-OUTER QUADRANT of
the LEFT breast, showing dense fibroglandular tissue without mass.
Mammographic images were processed with CAD.

Physical Exam: I palpate soft fibroglandular tissue in the
UPPER-OUTER QUADRANT of the LEFT breast. I palpate no discrete mass
in this region.

Ultrasound: Targeted ultrasound is performed, showing normal
appearing fibroglandular tissue in the LATERAL quadrants of the LEFT
breast.
IMPRESSION: No mammographic or ultrasound evidence for malignancy.

RECOMMENDATION:
Screening mammogram at age 40 unless there are persistent or
intervening clinical concerns. (Code:KC-C-GL7)

I have discussed the findings and recommendations with the patient.
If applicable, a reminder letter will be sent to the patient
regarding the next appointment.

BI-RADS CATEGORY  1: Negative.

## 2021-12-30 DIAGNOSIS — G43909 Migraine, unspecified, not intractable, without status migrainosus: Secondary | ICD-10-CM | POA: Diagnosis not present

## 2021-12-30 DIAGNOSIS — E785 Hyperlipidemia, unspecified: Secondary | ICD-10-CM | POA: Diagnosis not present

## 2021-12-30 DIAGNOSIS — R110 Nausea: Secondary | ICD-10-CM | POA: Diagnosis not present

## 2021-12-30 DIAGNOSIS — R69 Illness, unspecified: Secondary | ICD-10-CM | POA: Diagnosis not present

## 2021-12-30 DIAGNOSIS — R635 Abnormal weight gain: Secondary | ICD-10-CM | POA: Diagnosis not present

## 2021-12-30 DIAGNOSIS — Z79899 Other long term (current) drug therapy: Secondary | ICD-10-CM | POA: Diagnosis not present

## 2021-12-30 DIAGNOSIS — Z7182 Exercise counseling: Secondary | ICD-10-CM | POA: Diagnosis not present

## 2021-12-30 DIAGNOSIS — Z713 Dietary counseling and surveillance: Secondary | ICD-10-CM | POA: Diagnosis not present

## 2021-12-30 DIAGNOSIS — Z6832 Body mass index (BMI) 32.0-32.9, adult: Secondary | ICD-10-CM | POA: Diagnosis not present

## 2022-02-02 ENCOUNTER — Ambulatory Visit (INDEPENDENT_AMBULATORY_CARE_PROVIDER_SITE_OTHER): Payer: 59

## 2022-02-02 ENCOUNTER — Ambulatory Visit
Admission: EM | Admit: 2022-02-02 | Discharge: 2022-02-02 | Disposition: A | Discharge status: Home / Self Care | Payer: 59 | Attending: Student | Admitting: Student

## 2022-02-02 ENCOUNTER — Other Ambulatory Visit: Payer: Self-pay

## 2022-02-02 ENCOUNTER — Encounter: Payer: Self-pay | Admitting: Emergency Medicine

## 2022-02-02 DIAGNOSIS — M25571 Pain in right ankle and joints of right foot: Secondary | ICD-10-CM

## 2022-02-02 NOTE — ED Provider Notes (Signed)
[facility]    CSN: [id number] Arrival date & time: 02/02/22  1312      History   Chief Complaint Chief Complaint  Patient presents with   Ankle Pain    HPI Natalie Foster is a 36 y.o. female presenting with R ankle pain following stepping in a hole x1 day. History noncontributory. States she hear a pop and felt pain over the lateral aspect of the ankle. Ambulating with pain. Denies sensation changes. Denies pain or injury elsewhere, denies falls.   HPI  Past Medical History:  Diagnosis Date   HSP (Henoch-Schonlein purpura) nephritis ([place])     Patient Active Problem List   Diagnosis Date Noted   Class 1 obesity due to excess calories without serious comorbidity with body mass index (BMI) of 33.0 to 33.9 in adult 11/11/2021   Mixed hyperlipidemia 11/11/2021   Abdominal pain, RLQ (right lower quadrant) 05/11/2018    Past Surgical History:  Procedure Laterality Date   LAMINOTOMY     LAPAROSCOPIC BILATERAL SALPINGECTOMY Bilateral 07/07/2018   Procedure: LAPAROSCOPIC BILATERAL SALPINGECTOMY;  Surgeon: Florian Buff, MD;  Location: [facility];  Service: Gynecology;  Laterality: Bilateral;   LUMBAR DISC SURGERY     MOUTH SURGERY     NO PAST SURGERIES      OB History     Gravida  2   Para  2   Term  2   Preterm      AB      Living  2      SAB      IAB      Ectopic      Multiple      Live Births  1            Home Medications    Prior to Admission medications   Medication Sig Start Date End Date Taking? Authorizing Provider  ALPRAZolam Duanne Moron) 0.25 MG tablet Take 1 tablet by mouth 3 (three) times daily as needed. 10/07/21   [provider]  escitalopram (LEXAPRO) 20 MG tablet Take 20 mg by mouth daily. 10/12/21   [provider]  MAGNESIUM-OXIDE PO Take 1 tablet by mouth daily in the afternoon.    [provider]  Melatonin 10 MG TABS Take 10 mg by mouth at bedtime.    [provider]  Multiple  Vitamin (MULTIVITAMIN WITH MINERALS) TABS tablet Take 1 tablet by mouth daily.    [provider]  SUMAtriptan (IMITREX) 100 MG tablet Take by mouth as needed. 09/26/21   [provider]  Zinc Acetate, Oral, (ZINC ACETATE PO) Take 10 mg by mouth daily as needed.    [provider]    Family History Family History  Problem Relation Age of Onset   Cancer Mother    Hypertension Mother    Thyroid disease Mother    Cancer Father    Hyperlipidemia Father    Breast cancer Paternal Aunt    Breast cancer Paternal Aunt    Breast cancer Paternal Aunt    Cancer Paternal Grandmother        lung   Stroke Paternal Grandfather    Cancer Paternal Grandfather        lung    Social History Social History   Tobacco Use   Smoking status: Former    Packs/day: 0.25    Years: 3.00    Total pack years: 0.75    Types: Cigarettes    Quit date: 07/01/1988    Years  since quitting: 33.6   Smokeless tobacco: Never   Tobacco comments:    social smoker in college.  Vaping Use   Vaping Use: Never used  Substance Use Topics   Alcohol use: Yes    Comment: once a week   Drug use: No     Allergies   Cortisone and Pineapple   Review of Systems Review of Systems  Musculoskeletal:        R ankle pain   All other systems reviewed and are negative.    Physical Exam Triage Vital Signs ED Triage Vitals [02/02/22 1354]  Enc Vitals Group     BP 121/80     Pulse Rate 97     Resp 18     Temp 98.1 F (36.7 C)     Temp Source Oral     SpO2 96 %     Weight      Height      Head Circumference      Peak Flow      Pain Score 3     Pain Loc      Pain Edu?      Excl. in GC?    No data found.  Updated Vital Signs BP 121/80 (BP Location: Right Arm)   Pulse 97   Temp 98.1 F (36.7 C) (Oral)   Resp 18   SpO2 96%   Breastfeeding No   Visual Acuity Right Eye Distance:   Left Eye Distance:   Bilateral Distance:    Right Eye Near:   Left Eye Near:    Bilateral  Near:     Physical Exam Vitals reviewed.  Constitutional:      General: She is not in acute distress.    Appearance: Normal appearance. She is not ill-appearing.  HENT:     Head: Normocephalic and atraumatic.  Pulmonary:     Effort: Pulmonary effort is normal.  Musculoskeletal:     Comments: R ankle - 1+ swelling over the lateral malleolus, no skin color changes. TTP ATF ligament and lateral malleolus. No midfoot or medial malleolar pain. ROM dorsi and plantarflexion intact but with pain. Ambulating with pain. DP 2+, cap refill <2 seconds.   Neurological:     General: No focal deficit present.     Mental Status: She is alert and oriented to person, place, and time.  Psychiatric:        Mood and Affect: Mood normal.        Behavior: Behavior normal.        Thought Content: Thought content normal.        Judgment: Judgment normal.      UC Treatments / Results  Labs (all labs ordered are listed, but only abnormal results are displayed) Labs Reviewed - No data to display  EKG   Radiology DG Ankle Complete Right  Result Date: 02/02/2022 CLINICAL DATA:  Right ankle pain and swelling after injury EXAM: RIGHT ANKLE - COMPLETE 3+ VIEW COMPARISON:  None Available. FINDINGS: There is no evidence of fracture, dislocation, or joint effusion. There is no evidence of arthropathy or other focal bone abnormality. Soft tissue swelling overlies the lateral ankle. IMPRESSION: 1. No fracture or dislocation of the right ankle. 2. Soft tissue swelling over the lateral ankle. Electronically Signed   By: Duanne Guess D.O.   On: 02/02/2022 14:21    Procedures Procedures (including critical care time)  Medications Ordered in UC Medications - No data to display  Initial Impression / Assessment and  Plan / UC Course  I have reviewed the triage vital signs and the nursing notes.  Pertinent labs & imaging results that were available during my care of the patient were reviewed by me and considered  in my medical decision making (see chart for details).     This patient is a very pleasant 36 y.o. year old female presenting with R ankle sprain x1 day following stepping in hole. Neurovascularly intact.  Xray R ankle-  1. No fracture or dislocation of the right ankle. 2. Soft tissue swelling over the lateral ankle.  CAM boot provided at patient request. RICE. F/u with ortho if symptoms persist.   ED return precautions discussed. Patient verbalizes understanding and agreement.   Final Clinical Impressions(s) / UC Diagnoses   Final diagnoses:  Acute right ankle pain     Discharge Instructions      -Right ankle brace -Rest, ice, tylenol/ibuprofen, elevation  -Follow-up with orthopedist if symptoms persist in 5-7 days, information below.     ED Prescriptions   None    PDMP not reviewed this encounter.   Rhys Martini, PA-C 02/02/22 1455

## 2022-02-02 NOTE — Discharge Instructions (Addendum)
-  Right ankle brace -Rest, ice, tylenol/ibuprofen, elevation  -Follow-up with orthopedist if symptoms persist in 5-7 days, information below.

## 2022-02-02 NOTE — ED Triage Notes (Signed)
Pt reports stepped in a hole yesterday after walking off of deck. Pt reports heard a "crack" in right ankle and reports swelling ever since. Pt reports has tried ice on site and tylenol with minimal relief of symptoms. Pt able to bear weight but reports with pain.

## 2022-02-10 ENCOUNTER — Other Ambulatory Visit (HOSPITAL_COMMUNITY): Payer: Self-pay | Admitting: Family Medicine

## 2022-02-10 ENCOUNTER — Other Ambulatory Visit: Payer: Self-pay | Admitting: Family Medicine

## 2022-02-10 DIAGNOSIS — S93409A Sprain of unspecified ligament of unspecified ankle, initial encounter: Secondary | ICD-10-CM | POA: Diagnosis not present

## 2022-02-10 DIAGNOSIS — R229 Localized swelling, mass and lump, unspecified: Secondary | ICD-10-CM | POA: Diagnosis not present

## 2022-02-11 ENCOUNTER — Ambulatory Visit: Payer: 59 | Admitting: "Endocrinology

## 2022-02-11 DIAGNOSIS — S8001XA Contusion of right knee, initial encounter: Secondary | ICD-10-CM | POA: Diagnosis not present

## 2022-02-11 DIAGNOSIS — S93401A Sprain of unspecified ligament of right ankle, initial encounter: Secondary | ICD-10-CM | POA: Diagnosis not present

## 2022-02-13 ENCOUNTER — Ambulatory Visit: Payer: 59 | Admitting: "Endocrinology

## 2022-03-04 ENCOUNTER — Ambulatory Visit: Payer: 59 | Admitting: "Endocrinology

## 2022-03-11 DIAGNOSIS — M25571 Pain in right ankle and joints of right foot: Secondary | ICD-10-CM | POA: Diagnosis not present

## 2022-04-07 ENCOUNTER — Other Ambulatory Visit (HOSPITAL_COMMUNITY): Payer: Self-pay | Admitting: Family Medicine

## 2022-04-07 DIAGNOSIS — R229 Localized swelling, mass and lump, unspecified: Secondary | ICD-10-CM

## 2022-04-11 DIAGNOSIS — M25571 Pain in right ankle and joints of right foot: Secondary | ICD-10-CM | POA: Diagnosis not present

## 2022-04-15 DIAGNOSIS — M25571 Pain in right ankle and joints of right foot: Secondary | ICD-10-CM | POA: Diagnosis not present

## 2022-05-05 DIAGNOSIS — M25571 Pain in right ankle and joints of right foot: Secondary | ICD-10-CM | POA: Diagnosis not present

## 2022-05-19 DIAGNOSIS — M25571 Pain in right ankle and joints of right foot: Secondary | ICD-10-CM | POA: Diagnosis not present

## 2022-05-20 DIAGNOSIS — M25571 Pain in right ankle and joints of right foot: Secondary | ICD-10-CM | POA: Diagnosis not present

## 2022-05-22 DIAGNOSIS — Z0000 Encounter for general adult medical examination without abnormal findings: Secondary | ICD-10-CM | POA: Diagnosis not present

## 2022-05-22 DIAGNOSIS — E785 Hyperlipidemia, unspecified: Secondary | ICD-10-CM | POA: Diagnosis not present

## 2022-05-22 DIAGNOSIS — R69 Illness, unspecified: Secondary | ICD-10-CM | POA: Diagnosis not present

## 2022-05-22 DIAGNOSIS — R635 Abnormal weight gain: Secondary | ICD-10-CM | POA: Diagnosis not present

## 2022-05-22 DIAGNOSIS — E669 Obesity, unspecified: Secondary | ICD-10-CM | POA: Diagnosis not present

## 2022-05-26 DIAGNOSIS — M25571 Pain in right ankle and joints of right foot: Secondary | ICD-10-CM | POA: Diagnosis not present

## 2022-06-25 ENCOUNTER — Other Ambulatory Visit (HOSPITAL_COMMUNITY): Payer: Self-pay

## 2022-06-26 ENCOUNTER — Other Ambulatory Visit (HOSPITAL_COMMUNITY): Payer: Self-pay

## 2022-06-26 MED ORDER — SAXENDA 18 MG/3ML ~~LOC~~ SOPN
PEN_INJECTOR | SUBCUTANEOUS | 0 refills | Status: AC
  Filled 2022-06-26: qty 15, 35d supply, fill #0

## 2022-07-08 DIAGNOSIS — Z7182 Exercise counseling: Secondary | ICD-10-CM | POA: Diagnosis not present

## 2022-07-08 DIAGNOSIS — R69 Illness, unspecified: Secondary | ICD-10-CM | POA: Diagnosis not present

## 2022-07-08 DIAGNOSIS — R9431 Abnormal electrocardiogram [ECG] [EKG]: Secondary | ICD-10-CM | POA: Diagnosis not present

## 2022-07-08 DIAGNOSIS — Z713 Dietary counseling and surveillance: Secondary | ICD-10-CM | POA: Diagnosis not present

## 2022-07-08 DIAGNOSIS — E669 Obesity, unspecified: Secondary | ICD-10-CM | POA: Diagnosis not present

## 2022-07-08 DIAGNOSIS — Z23 Encounter for immunization: Secondary | ICD-10-CM | POA: Diagnosis not present

## 2022-08-07 DIAGNOSIS — R69 Illness, unspecified: Secondary | ICD-10-CM | POA: Diagnosis not present

## 2022-09-10 DIAGNOSIS — M5126 Other intervertebral disc displacement, lumbar region: Secondary | ICD-10-CM | POA: Diagnosis not present

## 2022-09-10 DIAGNOSIS — Z6834 Body mass index (BMI) 34.0-34.9, adult: Secondary | ICD-10-CM | POA: Diagnosis not present

## 2022-09-11 DIAGNOSIS — F411 Generalized anxiety disorder: Secondary | ICD-10-CM | POA: Diagnosis not present

## 2022-09-11 DIAGNOSIS — R69 Illness, unspecified: Secondary | ICD-10-CM | POA: Diagnosis not present

## 2022-10-01 DIAGNOSIS — I83813 Varicose veins of bilateral lower extremities with pain: Secondary | ICD-10-CM | POA: Diagnosis not present

## 2022-10-02 DIAGNOSIS — F909 Attention-deficit hyperactivity disorder, unspecified type: Secondary | ICD-10-CM | POA: Diagnosis not present

## 2022-10-02 DIAGNOSIS — F411 Generalized anxiety disorder: Secondary | ICD-10-CM | POA: Diagnosis not present

## 2022-10-08 DIAGNOSIS — M5126 Other intervertebral disc displacement, lumbar region: Secondary | ICD-10-CM | POA: Diagnosis not present

## 2022-10-16 DIAGNOSIS — F909 Attention-deficit hyperactivity disorder, unspecified type: Secondary | ICD-10-CM | POA: Diagnosis not present

## 2022-10-16 DIAGNOSIS — F411 Generalized anxiety disorder: Secondary | ICD-10-CM | POA: Diagnosis not present

## 2022-10-22 DIAGNOSIS — Z713 Dietary counseling and surveillance: Secondary | ICD-10-CM | POA: Diagnosis not present

## 2022-10-22 DIAGNOSIS — Z79899 Other long term (current) drug therapy: Secondary | ICD-10-CM | POA: Diagnosis not present

## 2022-10-22 DIAGNOSIS — L219 Seborrheic dermatitis, unspecified: Secondary | ICD-10-CM | POA: Diagnosis not present

## 2022-10-22 DIAGNOSIS — R9431 Abnormal electrocardiogram [ECG] [EKG]: Secondary | ICD-10-CM | POA: Diagnosis not present

## 2022-10-22 DIAGNOSIS — Z7182 Exercise counseling: Secondary | ICD-10-CM | POA: Diagnosis not present

## 2022-10-22 DIAGNOSIS — F909 Attention-deficit hyperactivity disorder, unspecified type: Secondary | ICD-10-CM | POA: Diagnosis not present

## 2022-10-22 DIAGNOSIS — R5383 Other fatigue: Secondary | ICD-10-CM | POA: Diagnosis not present

## 2022-10-22 DIAGNOSIS — F418 Other specified anxiety disorders: Secondary | ICD-10-CM | POA: Diagnosis not present

## 2022-10-22 DIAGNOSIS — E669 Obesity, unspecified: Secondary | ICD-10-CM | POA: Diagnosis not present

## 2022-10-22 DIAGNOSIS — R635 Abnormal weight gain: Secondary | ICD-10-CM | POA: Diagnosis not present

## 2022-10-22 DIAGNOSIS — R590 Localized enlarged lymph nodes: Secondary | ICD-10-CM | POA: Diagnosis not present

## 2022-10-27 ENCOUNTER — Other Ambulatory Visit (HOSPITAL_COMMUNITY): Payer: Self-pay | Admitting: Family Medicine

## 2022-10-27 DIAGNOSIS — R590 Localized enlarged lymph nodes: Secondary | ICD-10-CM

## 2022-10-28 DIAGNOSIS — F411 Generalized anxiety disorder: Secondary | ICD-10-CM | POA: Diagnosis not present

## 2022-10-30 DIAGNOSIS — F909 Attention-deficit hyperactivity disorder, unspecified type: Secondary | ICD-10-CM | POA: Diagnosis not present

## 2022-10-30 DIAGNOSIS — F411 Generalized anxiety disorder: Secondary | ICD-10-CM | POA: Diagnosis not present

## 2022-11-05 DIAGNOSIS — I83813 Varicose veins of bilateral lower extremities with pain: Secondary | ICD-10-CM | POA: Diagnosis not present

## 2022-11-11 DIAGNOSIS — M5136 Other intervertebral disc degeneration, lumbar region: Secondary | ICD-10-CM | POA: Diagnosis not present

## 2022-11-11 DIAGNOSIS — M359 Systemic involvement of connective tissue, unspecified: Secondary | ICD-10-CM | POA: Diagnosis not present

## 2022-11-11 DIAGNOSIS — E669 Obesity, unspecified: Secondary | ICD-10-CM | POA: Diagnosis not present

## 2022-11-11 DIAGNOSIS — R768 Other specified abnormal immunological findings in serum: Secondary | ICD-10-CM | POA: Diagnosis not present

## 2022-11-11 DIAGNOSIS — M542 Cervicalgia: Secondary | ICD-10-CM | POA: Diagnosis not present

## 2022-11-12 ENCOUNTER — Ambulatory Visit (HOSPITAL_COMMUNITY)
Admission: RE | Admit: 2022-11-12 | Discharge: 2022-11-12 | Disposition: A | Discharge status: Home / Self Care | Payer: 59 | Source: Ambulatory Visit | Attending: Family Medicine | Admitting: Family Medicine

## 2022-11-12 DIAGNOSIS — R221 Localized swelling, mass and lump, neck: Secondary | ICD-10-CM | POA: Diagnosis not present

## 2022-11-12 DIAGNOSIS — R590 Localized enlarged lymph nodes: Secondary | ICD-10-CM

## 2022-11-13 DIAGNOSIS — F411 Generalized anxiety disorder: Secondary | ICD-10-CM | POA: Diagnosis not present

## 2022-11-13 DIAGNOSIS — F909 Attention-deficit hyperactivity disorder, unspecified type: Secondary | ICD-10-CM | POA: Diagnosis not present

## 2022-11-19 DIAGNOSIS — E669 Obesity, unspecified: Secondary | ICD-10-CM | POA: Diagnosis not present

## 2022-11-19 DIAGNOSIS — M5136 Other intervertebral disc degeneration, lumbar region: Secondary | ICD-10-CM | POA: Diagnosis not present

## 2022-11-19 DIAGNOSIS — M359 Systemic involvement of connective tissue, unspecified: Secondary | ICD-10-CM | POA: Diagnosis not present

## 2022-11-19 DIAGNOSIS — M542 Cervicalgia: Secondary | ICD-10-CM | POA: Diagnosis not present

## 2022-11-19 DIAGNOSIS — M797 Fibromyalgia: Secondary | ICD-10-CM | POA: Diagnosis not present

## 2022-11-19 DIAGNOSIS — R768 Other specified abnormal immunological findings in serum: Secondary | ICD-10-CM | POA: Diagnosis not present

## 2022-12-10 DIAGNOSIS — M4807 Spinal stenosis, lumbosacral region: Secondary | ICD-10-CM | POA: Diagnosis not present

## 2022-12-10 DIAGNOSIS — M5126 Other intervertebral disc displacement, lumbar region: Secondary | ICD-10-CM | POA: Diagnosis not present

## 2022-12-11 DIAGNOSIS — F411 Generalized anxiety disorder: Secondary | ICD-10-CM | POA: Diagnosis not present

## 2022-12-11 DIAGNOSIS — F909 Attention-deficit hyperactivity disorder, unspecified type: Secondary | ICD-10-CM | POA: Diagnosis not present

## 2022-12-25 DIAGNOSIS — M5126 Other intervertebral disc displacement, lumbar region: Secondary | ICD-10-CM | POA: Diagnosis not present

## 2023-01-01 DIAGNOSIS — F411 Generalized anxiety disorder: Secondary | ICD-10-CM | POA: Diagnosis not present

## 2023-01-08 DIAGNOSIS — M1611 Unilateral primary osteoarthritis, right hip: Secondary | ICD-10-CM | POA: Diagnosis not present

## 2023-01-08 DIAGNOSIS — M25571 Pain in right ankle and joints of right foot: Secondary | ICD-10-CM | POA: Diagnosis not present

## 2023-01-22 DIAGNOSIS — F418 Other specified anxiety disorders: Secondary | ICD-10-CM | POA: Diagnosis not present

## 2023-01-22 DIAGNOSIS — E669 Obesity, unspecified: Secondary | ICD-10-CM | POA: Diagnosis not present

## 2023-01-22 DIAGNOSIS — R768 Other specified abnormal immunological findings in serum: Secondary | ICD-10-CM | POA: Diagnosis not present

## 2023-01-22 DIAGNOSIS — F909 Attention-deficit hyperactivity disorder, unspecified type: Secondary | ICD-10-CM | POA: Diagnosis not present

## 2023-01-22 DIAGNOSIS — Z7182 Exercise counseling: Secondary | ICD-10-CM | POA: Diagnosis not present

## 2023-01-22 DIAGNOSIS — Z713 Dietary counseling and surveillance: Secondary | ICD-10-CM | POA: Diagnosis not present

## 2023-02-05 DIAGNOSIS — F900 Attention-deficit hyperactivity disorder, predominantly inattentive type: Secondary | ICD-10-CM | POA: Diagnosis not present

## 2023-02-05 DIAGNOSIS — F411 Generalized anxiety disorder: Secondary | ICD-10-CM | POA: Diagnosis not present

## 2023-02-06 DIAGNOSIS — I83813 Varicose veins of bilateral lower extremities with pain: Secondary | ICD-10-CM | POA: Diagnosis not present

## 2023-02-09 DIAGNOSIS — F909 Attention-deficit hyperactivity disorder, unspecified type: Secondary | ICD-10-CM | POA: Diagnosis not present

## 2023-02-13 DIAGNOSIS — M7671 Peroneal tendinitis, right leg: Secondary | ICD-10-CM | POA: Diagnosis not present

## 2023-02-13 DIAGNOSIS — S93491A Sprain of other ligament of right ankle, initial encounter: Secondary | ICD-10-CM | POA: Diagnosis not present

## 2023-03-05 DIAGNOSIS — M7671 Peroneal tendinitis, right leg: Secondary | ICD-10-CM | POA: Diagnosis not present

## 2023-03-05 DIAGNOSIS — S93491A Sprain of other ligament of right ankle, initial encounter: Secondary | ICD-10-CM | POA: Diagnosis not present

## 2023-03-10 ENCOUNTER — Ambulatory Visit (INDEPENDENT_AMBULATORY_CARE_PROVIDER_SITE_OTHER): Payer: 59

## 2023-03-10 ENCOUNTER — Ambulatory Visit
Admission: EM | Admit: 2023-03-10 | Discharge: 2023-03-10 | Disposition: A | Discharge status: Home / Self Care | Payer: 59 | Attending: Nurse Practitioner | Admitting: Nurse Practitioner

## 2023-03-10 ENCOUNTER — Encounter: Payer: Self-pay | Admitting: Emergency Medicine

## 2023-03-10 ENCOUNTER — Other Ambulatory Visit: Payer: Self-pay

## 2023-03-10 DIAGNOSIS — S61431A Puncture wound without foreign body of right hand, initial encounter: Secondary | ICD-10-CM

## 2023-03-10 DIAGNOSIS — Z23 Encounter for immunization: Secondary | ICD-10-CM

## 2023-03-10 DIAGNOSIS — F411 Generalized anxiety disorder: Secondary | ICD-10-CM | POA: Diagnosis not present

## 2023-03-10 DIAGNOSIS — W540XXA Bitten by dog, initial encounter: Secondary | ICD-10-CM | POA: Diagnosis not present

## 2023-03-10 DIAGNOSIS — M7989 Other specified soft tissue disorders: Secondary | ICD-10-CM | POA: Diagnosis not present

## 2023-03-10 DIAGNOSIS — F900 Attention-deficit hyperactivity disorder, predominantly inattentive type: Secondary | ICD-10-CM | POA: Diagnosis not present

## 2023-03-10 MED ORDER — RABIES VACCINE, PCEC IM SUSR
1.0000 mL | Freq: Once | INTRAMUSCULAR | Status: AC
  Administered 2023-03-10: 1 mL via INTRAMUSCULAR

## 2023-03-10 MED ORDER — RABIES IMMUNE GLOBULIN 150 UNIT/ML IM INJ
20.0000 [IU]/kg | INJECTION | Freq: Once | INTRAMUSCULAR | Status: AC
  Administered 2023-03-10: 2025 [IU]

## 2023-03-10 MED ORDER — AMOXICILLIN-POT CLAVULANATE 875-125 MG PO TABS: 1.0000 | ORAL_TABLET | Freq: Two times a day (BID) | ORAL | 0 refills | Status: DC

## 2023-03-10 NOTE — ED Triage Notes (Signed)
Pt reports was petting dogs in the park last night and reports one bit right index finger and middle finger. Moderate swelling noted at time of triage.   Pt reports owner at the time reported dogs were vaccinated but reports doesn't know owner personally.   Pt reports last tetanus 2021.

## 2023-03-10 NOTE — Discharge Instructions (Addendum)
We have given you the rabies vaccine and Kedrab today (rabies immunoglobulin).  Please return on 03/13/2023, 03/17/2023, and 03/24/2023 to complete the rabies series.    Xray today does not show any broken bones or foreign objects.  Please continue cleaning the wounds twice daily with mild soap and water and apply a thin layer of antimicrobial ointment.  Start Augmentin and take it as prescribed to treat for infection.  Seek care if symptoms worsen.

## 2023-03-10 NOTE — ED Provider Notes (Signed)
[facility]    CSN: [id number] Arrival date & time: 03/10/23  1035      History   Chief Complaint Chief Complaint  Patient presents with   Animal Bite    HPI CAIYA BETTES is a 37 y.o. female.   Patient presents today for dog bite to right hand.  Reports she was petting a stranger's puppy at the park yesterday when the dog standing next to the puppy bit her hand in multiple places.  Reports she immediately washed her hands with soap and water and then covered with a bandaid.  She washed her hand again when she returned home and applied bacitracin and covered with a bandage.  Woke up today with increased swelling and pain in the right hand.  Has puncture wounds that are draining a little bit of blood to on the index and middle finger.  Reports these fingers are both very swollen, painful, and she has slight numbness in the fingertips.  No fever or nausea/vomiting.    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  Patient is confident she is not pregnant, has had an ablation.    Past Medical History:  Diagnosis Date   HSP (Henoch-Schonlein purpura) nephritis ([facility])     Patient Active Problem List   Diagnosis Date Noted   Class 1 obesity due to excess calories without serious comorbidity with body mass index (BMI) of 33.0 to 33.9 in adult 11/11/2021   Mixed hyperlipidemia 11/11/2021   Abdominal pain, RLQ (right lower quadrant) 05/11/2018    Past Surgical History:  Procedure Laterality Date   LAMINOTOMY     LAPAROSCOPIC BILATERAL SALPINGECTOMY Bilateral 07/07/2018   Procedure: LAPAROSCOPIC BILATERAL SALPINGECTOMY;  Surgeon: Lazaro Arms, MD;  Location: [facility];  Service: Gynecology;  Laterality: Bilateral;   LUMBAR DISC SURGERY     MOUTH SURGERY     NO PAST SURGERIES      OB History     Gravida  2   Para  2   Term  2   Preterm      AB       Living  2      SAB      IAB      Ectopic      Multiple      Live Births  1            Home Medications    Prior to Admission medications   Medication Sig Start Date End Date Taking? Authorizing Provider  amoxicillin-clavulanate (AUGMENTIN) 875-125 MG tablet Take 1 tablet by mouth 2 (two) times daily for 7 days. 03/10/23 03/17/23 Yes Valentino Nose, NP  ALPRAZolam Prudy Feeler) 0.25 MG tablet Take 1 tablet by mouth 3 (three) times daily as needed. 10/07/21   [provider]  escitalopram (LEXAPRO) 20 MG tablet Take 20 mg by mouth daily. 10/12/21   [provider]  MAGNESIUM-OXIDE PO Take 1 tablet by mouth daily in the afternoon.    [provider]  Melatonin 10 MG TABS Take 10 mg by mouth at bedtime.    [provider]  Multiple Vitamin (MULTIVITAMIN WITH MINERALS) TABS tablet Take 1 tablet by mouth daily.    [provider]  SUMAtriptan (IMITREX) 100 MG tablet Take by mouth as needed. 09/26/21   [provider]  Zinc Acetate, Oral, (ZINC ACETATE PO) Take  10 mg by mouth daily as needed.    [provider]    Family History Family History  Problem Relation Age of Onset   Cancer Mother    Hypertension Mother    Thyroid disease Mother    Cancer Father    Hyperlipidemia Father    Breast cancer Paternal Aunt    Breast cancer Paternal Aunt    Breast cancer Paternal Aunt    Cancer Paternal Grandmother        lung   Stroke Paternal Grandfather    Cancer Paternal Grandfather        lung    Social History Social History   Tobacco Use   Smoking status: Former    Current packs/day: 0.00    Average packs/day: 0.3 packs/day for 3.0 years (0.8 ttl pk-yrs)    Types: Cigarettes    Start date: 07/01/1985    Quit date: 07/01/1988    Years since quitting: 34.7   Smokeless tobacco: Never   Tobacco comments:    social smoker in college.  Vaping Use   Vaping status: Never Used  Substance Use Topics   Alcohol use: Yes     Comment: once a week   Drug use: No     Allergies   Cortisone and Pineapple   Review of Systems Review of Systems Per HPI  Physical Exam Triage Vital Signs ED Triage Vitals  Encounter Vitals Group     BP 03/10/23 1149 123/84     Systolic BP Percentile --      Diastolic BP Percentile --      Pulse Rate 03/10/23 1149 (!) 101     Resp 03/10/23 1149 20     Temp 03/10/23 1149 98 F (36.7 C)     Temp Source 03/10/23 1149 Oral     SpO2 03/10/23 1149 97 %     Weight 03/10/23 1209 223 lb 1.6 oz (101.2 kg)     Height --      Head Circumference --      Peak Flow --      Pain Score 03/10/23 1148 6     Pain Loc --      Pain Education --      Exclude from Growth Chart --    No data found.  Updated Vital Signs BP 123/84 (BP Location: Right Arm)   Pulse (!) 101   Temp 98 F (36.7 C) (Oral)   Resp 20   Wt 223 lb 1.6 oz (101.2 kg)   SpO2 97%   BMI 36.01 kg/m   Visual Acuity Right Eye Distance:   Left Eye Distance:   Bilateral Distance:    Right Eye Near:   Left Eye Near:    Bilateral Near:     Physical Exam Vitals and nursing note reviewed.  Constitutional:      General: She is not in acute distress.    Appearance: Normal appearance. She is not toxic-appearing.  HENT:     Head: Normocephalic and atraumatic.     Right Ear: External ear normal.     Left Ear: External ear normal.     Mouth/Throat:     Mouth: Mucous membranes are moist.     Pharynx: Oropharynx is clear.  Pulmonary:     Effort: Pulmonary effort is normal. No respiratory distress.  Musculoskeletal:     Cervical back: Normal range of motion.  Lymphadenopathy:     Cervical: No cervical adenopathy.  Skin:    General: Skin is warm  and dry.     Capillary Refill: Capillary refill takes less than 2 seconds.     Coloration: Skin is not jaundiced or pale.     Findings: Erythema present.     Comments: Puncture wounds noted to dorsal and volar aspect of second and third digits.  There is surrounding  erythema, edema, and bruising.  Patient able to flex fingers minimally.  Neurovascularly intact distal to puncture wounds.   Neurological:     Mental Status: She is alert and oriented to person, place, and time.     Motor: No weakness.     Gait: Gait normal.  Psychiatric:        Behavior: Behavior is cooperative.      UC Treatments / Results  Labs (all labs ordered are listed, but only abnormal results are displayed) Labs Reviewed - No data to display  EKG   Radiology DG Hand Complete Right  Result Date: 03/10/2023 CLINICAL DATA:  Dog bite yesterday. Puncture wounds to right second and third digits. EXAM: RIGHT HAND - COMPLETE 3+ VIEW COMPARISON:  None Available. FINDINGS: Normal bone mineralization. Mild proximal second and third finger soft tissue swelling. No radiopaque foreign body. No acute fracture or dislocation. Joint spaces are preserved. IMPRESSION: 1. Mild proximal second and third finger soft tissue swelling. No radiopaque foreign body. 2. No acute fracture or dislocation. Electronically Signed   By: Neita Garnet M.D.   On: 03/10/2023 12:51    Procedures Procedures (including critical care time)  Medications Ordered in UC Medications  rabies immune globulin (HYPERRAB/KEDRAB) injection 2,025 Units (2,025 Units Infiltration Given 03/10/23 1241)  rabies vaccine (RABAVERT) injection 1 mL (1 mL Intramuscular Given 03/10/23 1240)    Initial Impression / Assessment and Plan / UC Course  I have reviewed the triage vital signs and the nursing notes.  Pertinent labs & imaging results that were available during my care of the patient were reviewed by me and considered in my medical decision making (see chart for details).   Patient is well-appearing, normotensive, afebrile, not tachypneic, oxygenating well on room air.  Patient is mildly tachcardic in triage today.  1. Dog bite, initial encounter 2. Need for rabies vaccination X-ray imaging today is negative for acute  fracture or foreign body Kedrab given, rabies series started today and instructed to return I will given these to complete the series Start Augmentin to cover for bacterial infection from the dog bite Elevate hand, apply ice 15 minutes on, 45 minutes off every hour while awake Start Tylenol/ibuprofen as needed for pain Wound care discussed with patient and applied today Seek care if symptoms worsen despite treatment  The patient was given the opportunity to ask questions.  All questions answered to their satisfaction.  The patient is in agreement to this plan.    Final Clinical Impressions(s) / UC Diagnoses   Final diagnoses:  Dog bite, initial encounter  Need for rabies vaccination     Discharge Instructions      We have given you the rabies vaccine and Kedrab today (rabies immunoglobulin).  Please return on 03/13/2023, 03/17/2023, and 03/24/2023 to complete the rabies series.    Xray today does not show any broken bones or foreign objects.  Please continue cleaning the wounds twice daily with mild soap and water and apply a thin layer of antimicrobial ointment.  Start Augmentin and take it as prescribed to treat for infection.  Seek care if symptoms worsen.    ED Prescriptions  Medication Sig Dispense Auth. Provider   amoxicillin-clavulanate (AUGMENTIN) 875-125 MG tablet Take 1 tablet by mouth 2 (two) times daily for 7 days. 14 tablet Valentino Nose, NP      PDMP not reviewed this encounter.   Valentino Nose, NP 03/10/23 1302

## 2023-03-13 ENCOUNTER — Ambulatory Visit
Admission: RE | Admit: 2023-03-13 | Discharge: 2023-03-13 | Disposition: A | Discharge status: Home / Self Care | Payer: 59 | Source: Ambulatory Visit | Attending: Family Medicine | Admitting: Family Medicine

## 2023-03-13 DIAGNOSIS — Z23 Encounter for immunization: Secondary | ICD-10-CM

## 2023-03-13 DIAGNOSIS — M25571 Pain in right ankle and joints of right foot: Secondary | ICD-10-CM | POA: Diagnosis not present

## 2023-03-13 MED ORDER — RABIES VACCINE, PCEC IM SUSR
1.0000 mL | Freq: Once | INTRAMUSCULAR | Status: AC
  Administered 2023-03-13: 1 mL via INTRAMUSCULAR

## 2023-03-13 NOTE — ED Triage Notes (Signed)
Pt reports some nasuea for 24 hrs after first rabies vaccine. Couldn't eat  Denies any other sxs

## 2023-03-17 ENCOUNTER — Ambulatory Visit
Admission: RE | Admit: 2023-03-17 | Discharge: 2023-03-17 | Disposition: A | Discharge status: Home / Self Care | Payer: 59 | Source: Ambulatory Visit | Attending: Nurse Practitioner | Admitting: Nurse Practitioner

## 2023-03-17 VITALS — BP 112/80 | HR 101 | Temp 98.3°F | Resp 18

## 2023-03-17 DIAGNOSIS — Z23 Encounter for immunization: Secondary | ICD-10-CM | POA: Diagnosis not present

## 2023-03-17 DIAGNOSIS — Z203 Contact with and (suspected) exposure to rabies: Secondary | ICD-10-CM | POA: Diagnosis not present

## 2023-03-17 MED ORDER — RABIES VACCINE, PCEC IM SUSR
1.0000 mL | Freq: Once | INTRAMUSCULAR | Status: AC
  Administered 2023-03-17: 1 mL via INTRAMUSCULAR

## 2023-03-17 NOTE — ED Triage Notes (Signed)
Here for 3rd rabies vaccine.

## 2023-03-19 DIAGNOSIS — M7671 Peroneal tendinitis, right leg: Secondary | ICD-10-CM | POA: Diagnosis not present

## 2023-03-19 DIAGNOSIS — S93491A Sprain of other ligament of right ankle, initial encounter: Secondary | ICD-10-CM | POA: Diagnosis not present

## 2023-03-24 ENCOUNTER — Ambulatory Visit
Admission: EM | Admit: 2023-03-24 | Discharge: 2023-03-24 | Disposition: A | Discharge status: Home / Self Care | Payer: 59 | Attending: Family Medicine | Admitting: Family Medicine

## 2023-03-24 DIAGNOSIS — Z23 Encounter for immunization: Secondary | ICD-10-CM

## 2023-03-24 MED ORDER — RABIES VACCINE, PCEC IM SUSR
1.0000 mL | Freq: Once | INTRAMUSCULAR | Status: AC
  Administered 2023-03-24: 1 mL via INTRAMUSCULAR

## 2023-03-24 MED ORDER — ONDANSETRON 4 MG PO TBDP
4.0000 mg | ORAL_TABLET | Freq: Once | ORAL | Status: AC
  Administered 2023-03-24: 4 mg via ORAL

## 2023-03-24 NOTE — ED Triage Notes (Signed)
Pt reports she experiences nausea after each rabies shot.    Pt will be given zofran

## 2023-03-25 DIAGNOSIS — M359 Systemic involvement of connective tissue, unspecified: Secondary | ICD-10-CM | POA: Diagnosis not present

## 2023-03-25 DIAGNOSIS — M797 Fibromyalgia: Secondary | ICD-10-CM | POA: Diagnosis not present

## 2023-03-25 DIAGNOSIS — M5136 Other intervertebral disc degeneration, lumbar region: Secondary | ICD-10-CM | POA: Diagnosis not present

## 2023-03-25 DIAGNOSIS — M542 Cervicalgia: Secondary | ICD-10-CM | POA: Diagnosis not present

## 2023-03-25 DIAGNOSIS — R768 Other specified abnormal immunological findings in serum: Secondary | ICD-10-CM | POA: Diagnosis not present

## 2023-03-30 DIAGNOSIS — M797 Fibromyalgia: Secondary | ICD-10-CM | POA: Diagnosis not present

## 2023-03-30 DIAGNOSIS — M359 Systemic involvement of connective tissue, unspecified: Secondary | ICD-10-CM | POA: Diagnosis not present

## 2023-03-30 DIAGNOSIS — R5383 Other fatigue: Secondary | ICD-10-CM | POA: Diagnosis not present

## 2023-03-30 DIAGNOSIS — Z6835 Body mass index (BMI) 35.0-35.9, adult: Secondary | ICD-10-CM | POA: Diagnosis not present

## 2023-04-08 DIAGNOSIS — Z6835 Body mass index (BMI) 35.0-35.9, adult: Secondary | ICD-10-CM | POA: Diagnosis not present

## 2023-04-08 DIAGNOSIS — R5383 Other fatigue: Secondary | ICD-10-CM | POA: Diagnosis not present

## 2023-04-09 DIAGNOSIS — F900 Attention-deficit hyperactivity disorder, predominantly inattentive type: Secondary | ICD-10-CM | POA: Diagnosis not present

## 2023-04-09 DIAGNOSIS — F411 Generalized anxiety disorder: Secondary | ICD-10-CM | POA: Diagnosis not present

## 2023-04-13 ENCOUNTER — Encounter: Payer: Self-pay | Admitting: Obstetrics & Gynecology

## 2023-04-13 ENCOUNTER — Other Ambulatory Visit (HOSPITAL_COMMUNITY)
Admission: RE | Admit: 2023-04-13 | Discharge: 2023-04-13 | Disposition: A | Discharge status: Home / Self Care | Payer: 59 | Source: Ambulatory Visit | Attending: Obstetrics & Gynecology | Admitting: Obstetrics & Gynecology

## 2023-04-13 ENCOUNTER — Ambulatory Visit (INDEPENDENT_AMBULATORY_CARE_PROVIDER_SITE_OTHER): Payer: 59 | Admitting: Obstetrics & Gynecology

## 2023-04-13 VITALS — BP 104/79 | HR 80 | Ht 66.0 in | Wt 226.0 lb

## 2023-04-13 DIAGNOSIS — Z01419 Encounter for gynecological examination (general) (routine) without abnormal findings: Secondary | ICD-10-CM | POA: Insufficient documentation

## 2023-04-13 NOTE — Progress Notes (Signed)
Subjective:     Natalie Foster is a 37 y.o. female here for a routine exam.  No LMP recorded. Patient has had an ablation. [id number] Birth Control Method:  BTL + endo ablation Menstrual Calendar(currently): amenorrhea  Current complaints: post coital irritation and spotting.   Current acute medical issues:  right ankle surgery + Ehlers-Danlos issues   Recent Gynecologic History No LMP recorded. Patient has had an ablation. Last Pap: 2019,  normal Last mammogram: na,    Past Medical History:  Diagnosis Date   HSP (Henoch-Schonlein purpura) nephritis (HCC)     Past Surgical History:  Procedure Laterality Date   LAMINOTOMY     LAPAROSCOPIC BILATERAL SALPINGECTOMY Bilateral 07/07/2018   Procedure: LAPAROSCOPIC BILATERAL SALPINGECTOMY;  Surgeon: Lazaro Arms, MD;  Location: [facility];  Service: Gynecology;  Laterality: Bilateral;   LUMBAR DISC SURGERY     MOUTH SURGERY     NO PAST SURGERIES      OB History     Gravida  2   Para  2   Term  2   Preterm      AB      Living  2      SAB      IAB      Ectopic      Multiple      Live Births  1           Social History   Socioeconomic History   Marital status: Married    Spouse name: Not on file   Number of children: Not on file   Years of education: Not on file   Highest education level: Not on file  Occupational History   Not on file  Tobacco Use   Smoking status: Former    Types: Cigarettes   Smokeless tobacco: Never   Tobacco comments:    social smoker in college.  Vaping Use   Vaping status: Never Used  Substance and Sexual Activity   Alcohol use: Yes    Comment: once a week   Drug use: No   Sexual activity: Yes    Birth control/protection: Surgical  Other Topics Concern   Not on file  Social History Narrative   Not on file   Social Determinants of Health   Financial Resource Strain: Medium Risk (04/13/2023)   Overall Financial Resource Strain (CARDIA)    Difficulty of Paying Living  Expenses: Somewhat hard  Food Insecurity: No Food Insecurity (04/13/2023)   Hunger Vital Sign    Worried About Running Out of Food in the Last Year: Never true    Ran Out of Food in the Last Year: Never true  Transportation Needs: No Transportation Needs (04/13/2023)   PRAPARE - Administrator, Civil Service (Medical): No    Lack of Transportation (Non-Medical): No  Physical Activity: Insufficiently Active (04/13/2023)   Exercise Vital Sign    Days of Exercise per Week: 2 days    Minutes of Exercise per Session: 30 min  Stress: Stress Concern Present (04/13/2023)   [employer] of Occupational Health - Occupational Stress Questionnaire    Feeling of Stress : To some extent  Social Connections: Socially Integrated (04/13/2023)   Social Connection and Isolation Panel [NHANES]    Frequency of Communication with Friends and Family: More than three times a week    Frequency of Social Gatherings with Friends and Family: More than three times a week    Attends Religious Services: More than 4 times per  year    Active Member of Clubs or Organizations: Yes    Attends Engineer, structural: More than 4 times per year    Marital Status: Married    Family History  Problem Relation Age of Onset   Cancer Mother    Hypertension Mother    Thyroid disease Mother    Cancer Father    Hyperlipidemia Father    Breast cancer Paternal Aunt    Breast cancer Paternal Aunt    Breast cancer Paternal Aunt    Cancer Paternal Grandmother        lung   Stroke Paternal Grandfather    Cancer Paternal Grandfather        lung     Current Outpatient Medications:    ALPRAZolam (XANAX) 0.25 MG tablet, Take 1 tablet by mouth 3 (three) times daily as needed., Disp: , Rfl:    escitalopram (LEXAPRO) 20 MG tablet, Take 20 mg by mouth daily., Disp: , Rfl:    MAGNESIUM-OXIDE PO, Take 1 tablet by mouth daily in the afternoon., Disp: , Rfl:    Multiple Vitamin (MULTIVITAMIN WITH MINERALS) TABS  tablet, Take 1 tablet by mouth daily., Disp: , Rfl:    SUMAtriptan (IMITREX) 100 MG tablet, Take by mouth as needed., Disp: , Rfl:    Zinc Acetate, Oral, (ZINC ACETATE PO), Take 10 mg by mouth daily as needed., Disp: , Rfl:    Melatonin 10 MG TABS, Take 10 mg by mouth at bedtime. (Patient not taking: Reported on 04/13/2023), Disp: , Rfl:   Review of Systems  Review of Systems  Constitutional: Negative for fever, chills, weight loss, malaise/fatigue and diaphoresis.  HENT: Negative for hearing loss, ear pain, nosebleeds, congestion, sore throat, neck pain, tinnitus and ear discharge.   Eyes: Negative for blurred vision, double vision, photophobia, pain, discharge and redness.  Respiratory: Negative for cough, hemoptysis, sputum production, shortness of breath, wheezing and stridor.   Cardiovascular: Negative for chest pain, palpitations, orthopnea, claudication, leg swelling and PND.  Gastrointestinal: negative for abdominal pain. Negative for heartburn, nausea, vomiting, diarrhea, constipation, blood in stool and melena.  Genitourinary: Negative for dysuria, urgency, frequency, hematuria and flank pain.  Musculoskeletal: Negative for myalgias, back pain, joint pain and falls.  Skin: Negative for itching and rash.  Neurological: Negative for dizziness, tingling, tremors, sensory change, speech change, focal weakness, seizures, loss of consciousness, weakness and headaches.  Endo/Heme/Allergies: Negative for environmental allergies and polydipsia. Does not bruise/bleed easily.  Psychiatric/Behavioral: Negative for depression, suicidal ideas, hallucinations, memory loss and substance abuse. The patient is not nervous/anxious and does not have insomnia.        Objective:  Blood pressure 104/79, pulse 80, height 5\' 6"  (1.676 m), weight 226 lb (102.5 kg).   Physical Exam  Vitals reviewed. Constitutional: She is oriented to person, place, and time. She appears well-developed and well-nourished.   HENT:  Head: Normocephalic and atraumatic.        Right Ear: External ear normal.  Left Ear: External ear normal.  Nose: Nose normal.  Mouth/Throat: Oropharynx is clear and moist.  Eyes: Conjunctivae and EOM are normal. Pupils are equal, round, and reactive to light. Right eye exhibits no discharge. Left eye exhibits no discharge. No scleral icterus.  Neck: Normal range of motion. Neck supple. No tracheal deviation present. No thyromegaly present.  Cardiovascular: Normal rate, regular rhythm, normal heart sounds and intact distal pulses.  Exam reveals no gallop and no friction rub.   No murmur heard. Respiratory: Effort normal  and breath sounds normal. No respiratory distress. She has no wheezes. She has no rales. She exhibits no tenderness.  GI: Soft. Bowel sounds are normal. She exhibits no distension and no mass. There is no tenderness. There is no rebound and no guarding.  Genitourinary:  Breasts no masses skin changes or nipple changes bilaterally      Vulva is normal without lesions Vagina is pink moist without discharge Cervix normal in appearance and pap is done Uterus is normal size shape and contour Adnexa is negative with normal sized ovaries   Musculoskeletal: Normal range of motion. She exhibits no edema and no tenderness.  Neurological: She is alert and oriented to person, place, and time. She has normal reflexes. She displays normal reflexes. No cranial nerve deficit. She exhibits normal muscle tone. Coordination normal.  Skin: Skin is warm and dry. No rash noted. No erythema. No pallor.  Psychiatric: She has a normal mood and affect. Her behavior is normal. Judgment and thought content normal.       Medications Ordered at today's visit: No orders of the defined types were placed in this encounter.   Other orders placed at today's visit: No orders of the defined types were placed in this encounter.     Assessment:    Normal Gyn exam.   Small labia minora  sebaceous cyst, not infected Plan:    Contraception: tubal ligation. Follow up in: 3 years.     No follow-ups on file.

## 2023-04-15 LAB — CYTOLOGY - PAP
Comment: NEGATIVE
Diagnosis: NEGATIVE
High risk HPV: NEGATIVE

## 2023-04-16 DIAGNOSIS — F909 Attention-deficit hyperactivity disorder, unspecified type: Secondary | ICD-10-CM | POA: Diagnosis not present

## 2023-04-16 DIAGNOSIS — F411 Generalized anxiety disorder: Secondary | ICD-10-CM | POA: Diagnosis not present

## 2023-05-05 ENCOUNTER — Encounter (HOSPITAL_BASED_OUTPATIENT_CLINIC_OR_DEPARTMENT_OTHER): Payer: Self-pay | Admitting: *Deleted

## 2023-05-07 ENCOUNTER — Encounter (HOSPITAL_BASED_OUTPATIENT_CLINIC_OR_DEPARTMENT_OTHER): Payer: Self-pay | Admitting: Orthopaedic Surgery

## 2023-05-07 NOTE — Progress Notes (Signed)
   05/07/23 1015  Pre-op Phone Call  Surgery Date Verified 05/13/23  Arrival Time Verified 0600  Surgery Location Verified [facility]  Medical History Reviewed Yes  Is the patient taking a GLP-1 receptor agonist? No  Does the patient have diabetes? No diagnosis of diabetes  Do you have a history of heart problems? No  Does patient have other implanted devices? No  Patient Teaching Pre / Post Procedure  Patient educated about smoking cessation 24 hours prior to surgery. N/A Non-Smoker  Patient verbalizes understanding of bowel prep? N/A  Med Rec Completed Yes  Take the Following Meds the Morning of Surgery no meds AM of sx  Recent  Lab Work, EKG, CXR? No  NPO (Including gum & candy) After midnight  Stop Solids, Milk, Candy, and Gum STARTING AT MIDNIGHT  Responsible adult to drive and be with you for 24 hours? Yes  Name & Phone Number for Ride/Caregiver husband, Pennie Rushing  No Jewelry, money, nail polish or make-up.  No lotions, powders, perfumes. No shaving  48 hrs. prior to surgery. Yes  Contacts, Dentures & Glasses Will Have to be Removed Before OR. Yes  Please bring your ID and Insurance Card the morning of your surgery. (Surgery Centers Only) Yes  Bring any papers or x-rays with you that your surgeon gave you. Yes  Instructed to contact the location of procedure/ provider if they or anyone in their household develops symptoms or tests positive for COVID-19, has close contact with someone who tests positive for COVID, or has known exposure to any contagious illness. Yes  Call this number the morning of surgery  with any problems that may cancel your surgery. [phone]  Covid-19 Assessment  Have you had a positive COVID-19 test within the previous 90 days? No  COVID Testing Guidance Proceed with the additional questions.  Patient's surgery required a COVID-19 test (cardiothoracic, complex ENT, and bronchoscopies/ EBUS) No  Have you been unmasked and in close contact with anyone with COVID-19  or COVID-19 symptoms within the past 10 days? No  Do you or anyone in your household currently have any COVID-19 symptoms? No

## 2023-05-07 NOTE — Progress Notes (Signed)
Reviewed chart (history of nephritis) with Dr Salvadore Farber who said OK to proceed with surgery. LM on pt's VM to come for blood work (BMP)

## 2023-05-11 NOTE — Discharge Instructions (Signed)
Netta Cedars, MD [facility]  Please read the following information regarding your care after surgery.  Medications  You only need a prescription for the narcotic pain medicine (ex. oxycodone, Percocet, Norco).  All of the other medicines listed below are available over the counter. ? Aleve 2 pills twice a day for the first 3 days after surgery. ? acetominophen (Tylenol) 650 mg every 4-6 hours as you need for minor to moderate pain ? oxycodone as prescribed for severe pain  ? To help prevent blood clots, take aspirin (81 mg) twice daily for 28 days after surgery (or total duration of nonweightbearing).  You should also get up every hour while you are awake to move around.  Weight Bearing ? Do NOT bear any weight on the operated leg or foot. This means do NOT touch your surgical leg to the ground!  Cast / Splint / Dressing ? If you have a splint, do NOT remove this. Keep your splint, cast or dressing clean and dry.  Don't put anything (coat hanger, pencil, etc) down inside of it.  If it gets wet, call the office immediately to schedule an appointment for a cast change.  Swelling IMPORTANT: It is normal for you to have swelling where you had surgery. To reduce swelling and pain, keep at least 3 pillows under your leg so that your toes are above your nose and your heel is above the level of your hip.  It may be necessary to keep your foot or leg elevated for several weeks.  This is critical to helping your incisions heal and your pain to feel better.  Follow Up Call my office at [phone] when you are discharged from the hospital or surgery center to schedule an appointment to be seen 7-10 days after surgery.  Call my office at [phone] if you develop a fever >101.5 F, nausea, vomiting, bleeding from the surgical site or severe pain.        Post Anesthesia Home Care Instructions  Activity: Get plenty of rest for the remainder of the day. A responsible individual must stay  with you for 24 hours following the procedure.  For the next 24 hours, DO NOT: -Drive a car -Advertising copywriter -Drink alcoholic beverages -Take any medication unless instructed by your physician -Make any legal decisions or sign important papers.  Meals: Start with liquid foods such as gelatin or soup. Progress to regular foods as tolerated. Avoid greasy, spicy, heavy foods. If nausea and/or vomiting occur, drink only clear liquids until the nausea and/or vomiting subsides. Call your physician if vomiting continues.  Special Instructions/Symptoms: Your throat may feel dry or sore from the anesthesia or the breathing tube placed in your throat during surgery. If this causes discomfort, gargle with warm salt water. The discomfort should disappear within 24 hours.  If you had a scopolamine patch placed behind your ear for the management of post- operative nausea and/or vomiting:  1. The medication in the patch is effective for 72 hours, after which it should be removed.  Wrap patch in a tissue and discard in the trash. Wash hands thoroughly with soap and water. 2. You may remove the patch earlier than 72 hours if you experience unpleasant side effects which may include dry mouth, dizziness or visual disturbances. 3. Avoid touching the patch. Wash your hands with soap and water after contact with the patch.      Regional Anesthesia Blocks  1. You may not be able to move or feel the "blocked" extremity  after a regional anesthetic block. This may last may last from 3-48 hours after placement, but it will go away. The length of time depends on the medication injected and your individual response to the medication. As the nerves start to wake up, you may experience tingling as the movement and feeling returns to your extremity. If the numbness and inability to move your extremity has not gone away after 48 hours, please call your surgeon.   2. The extremity that is blocked will need to be protected  until the numbness is gone and the strength has returned. Because you cannot feel it, you will need to take extra care to avoid injury. Because it may be weak, you may have difficulty moving it or using it. You may not know what position it is in without looking at it while the block is in effect.  3. For blocks in the legs and feet, returning to weight bearing and walking needs to be done carefully. You will need to wait until the numbness is entirely gone and the strength has returned. You should be able to move your leg and foot normally before you try and bear weight or walk. You will need someone to be with you when you first try to ensure you do not fall and possibly risk injury.  4. Bruising and tenderness at the needle site are common side effects and will resolve in a few days.  5. Persistent numbness or new problems with movement should be communicated to the surgeon or the [facility] [phone] [facility] [phone]).

## 2023-05-11 NOTE — H&P (Signed)
ORTHOPAEDIC SURGERY H&P  Subjective:  The patient presents with right lateral ankle instability.   Past Medical History:  Diagnosis Date   HSP (Henoch-Schonlein purpura) nephritis (HCC)     Past Surgical History:  Procedure Laterality Date   LAMINOTOMY     LAPAROSCOPIC BILATERAL SALPINGECTOMY Bilateral 07/07/2018   Procedure: LAPAROSCOPIC BILATERAL SALPINGECTOMY;  Surgeon: Lazaro Arms, MD;  Location: [facility];  Service: Gynecology;  Laterality: Bilateral;   LUMBAR DISC SURGERY     MOUTH SURGERY     NO PAST SURGERIES       (Not in an outpatient encounter)    Allergies  Allergen Reactions   Cortisone Nausea And Vomiting   Pineapple Hives and Itching    Also mango and kiwi    Social History   Socioeconomic History   Marital status: Married    Spouse name: Not on file   Number of children: Not on file   Years of education: Not on file   Highest education level: Not on file  Occupational History   Not on file  Tobacco Use   Smoking status: Former    Types: Cigarettes   Smokeless tobacco: Never   Tobacco comments:    social smoker in college.  Vaping Use   Vaping status: Never Used  Substance and Sexual Activity   Alcohol use: Yes    Comment: once a week   Drug use: No   Sexual activity: Yes    Birth control/protection: Surgical  Other Topics Concern   Not on file  Social History Narrative   Not on file   Social Determinants of Health   Financial Resource Strain: Medium Risk (04/13/2023)   Overall Financial Resource Strain (CARDIA)    Difficulty of Paying Living Expenses: Somewhat hard  Food Insecurity: No Food Insecurity (04/13/2023)   Hunger Vital Sign    Worried About Running Out of Food in the Last Year: Never true    Ran Out of Food in the Last Year: Never true  Transportation Needs: No Transportation Needs (04/13/2023)   PRAPARE - Administrator, Civil Service (Medical): No    Lack of Transportation (Non-Medical): No  Physical  Activity: Insufficiently Active (04/13/2023)   Exercise Vital Sign    Days of Exercise per Week: 2 days    Minutes of Exercise per Session: 30 min  Stress: Stress Concern Present (04/13/2023)   [employer] of Occupational Health - Occupational Stress Questionnaire    Feeling of Stress : To some extent  Social Connections: Socially Integrated (04/13/2023)   Social Connection and Isolation Panel [NHANES]    Frequency of Communication with Friends and Family: More than three times a week    Frequency of Social Gatherings with Friends and Family: More than three times a week    Attends Religious Services: More than 4 times per year    Active Member of [employer] or Organizations: Yes    Attends Banker Meetings: More than 4 times per year    Marital Status: Married  Catering manager Violence: Not At Risk (04/13/2023)   Humiliation, Afraid, Rape, and Kick questionnaire    Fear of Current or Ex-Partner: No    Emotionally Abused: No    Physically Abused: No    Sexually Abused: No     History reviewed. No pertinent family history.   Review of Systems Pertinent items are noted in HPI.  Objective: Vital signs in last 24 hours:    05/07/2023   10:15 AM 04/13/2023  8:34 AM 03/24/2023    6:49 PM  Vitals with BMI  Height 5\' 6"  5\' 6"    Weight 220 lbs 226 lbs   BMI 35.53 36.49   Systolic  104 107  Diastolic  79 72  Pulse  80 99      EXAM: General: Well nourished, well developed. Awake, alert and oriented to time, place, person. Normal mood and affect. No apparent distress. Breathing room air.  Operative Lower Extremity: Alignment - Neutral Deformity - None Skin intact Tenderness to palpation - right LLC and peroneal tendons 5/5 TA, PT, GS, Per, EHL, FHL Sensation intact to light touch throughout Palpable DP and PT pulses Special testing: None  The contralateral foot/ankle was examined for comparison and noted to be neurovascularly intact with no localized  deformity, swelling, or tenderness.  Imaging Review All images taken were independently reviewed by me.  Assessment/Plan: The clinical and radiographic findings were reviewed and discussed at length with the patient.  The patient has  right lateral ankle instability.  We spoke at length about the natural course of these findings. We discussed nonoperative and operative treatment options in detail.  The risks and benefits were presented and reviewed. The risks due to arthroscopy, implant failure/irritation, infection, stiffness, nerve/vessel/tendon injury, wound healing issues, failure of this surgery, need for further surgery, thromboembolic events, amputation, death among others were discussed. The patient acknowledged the explanation and agreed to proceed with the plan.  Natalie Foster  Orthopaedic Surgery [facility]

## 2023-05-12 ENCOUNTER — Encounter (HOSPITAL_BASED_OUTPATIENT_CLINIC_OR_DEPARTMENT_OTHER)
Admission: RE | Admit: 2023-05-12 | Discharge: 2023-05-12 | Disposition: A | Discharge status: Home / Self Care | Payer: 59 | Source: Ambulatory Visit | Attending: Orthopaedic Surgery | Admitting: Orthopaedic Surgery

## 2023-05-12 ENCOUNTER — Encounter (HOSPITAL_BASED_OUTPATIENT_CLINIC_OR_DEPARTMENT_OTHER): Payer: Self-pay | Admitting: Orthopaedic Surgery

## 2023-05-12 DIAGNOSIS — Z90722 Acquired absence of ovaries, bilateral: Secondary | ICD-10-CM | POA: Diagnosis not present

## 2023-05-12 DIAGNOSIS — Z01812 Encounter for preprocedural laboratory examination: Secondary | ICD-10-CM | POA: Insufficient documentation

## 2023-05-12 DIAGNOSIS — Z87891 Personal history of nicotine dependence: Secondary | ICD-10-CM | POA: Diagnosis not present

## 2023-05-12 DIAGNOSIS — M25371 Other instability, right ankle: Secondary | ICD-10-CM | POA: Diagnosis present

## 2023-05-12 DIAGNOSIS — Z01818 Encounter for other preprocedural examination: Secondary | ICD-10-CM | POA: Diagnosis not present

## 2023-05-12 DIAGNOSIS — Z5986 Financial insecurity: Secondary | ICD-10-CM | POA: Diagnosis not present

## 2023-05-12 LAB — BASIC METABOLIC PANEL
Anion gap: 12 (ref 5–15)
BUN: 9 mg/dL (ref 6–20)
CO2: 26 mmol/L (ref 22–32)
Calcium: 9.1 mg/dL (ref 8.9–10.3)
Chloride: 99 mmol/L (ref 98–111)
Creatinine, Ser: 0.59 mg/dL (ref 0.44–1.00)
GFR, Estimated: >60 mL/min (ref >60)
Glucose, Bld: 89 mg/dL (ref 70–99)
Potassium: 4.4 mmol/L (ref 3.5–5.1)
Sodium: 137 mmol/L (ref 135–145)

## 2023-05-12 MED ORDER — CHLORHEXIDINE GLUCONATE 4 % EX SOLN: 60.0000 mL | Freq: Once | CUTANEOUS | Status: DC

## 2023-05-12 NOTE — Anesthesia Preprocedure Evaluation (Addendum)
Anesthesia Evaluation  Patient identified by MRN, date of birth, ID band Patient awake    Reviewed: Allergy & Precautions, NPO status , Patient's Chart, lab work & pertinent test results  History of Anesthesia Complications (+) PONV and history of anesthetic complications  Airway Mallampati: II  TM Distance: >3 FB Neck ROM: Full    Dental no notable dental hx. (+) Teeth Intact, Dental Advisory Given   Pulmonary former smoker   Pulmonary exam normal breath sounds clear to auscultation       Cardiovascular Normal cardiovascular exam Rhythm:Regular Rate:Normal     Neuro/Psych  Headaches    GI/Hepatic   Endo/Other  negative endocrine ROS    Renal/GU Lab Results      Component                Value               Date                      NA                       137                 05/12/2023                CL                       99                  05/12/2023                K                        4.4                 05/12/2023                CO2                      26                  05/12/2023                BUN                      9                   05/12/2023                CREATININE               0.59                05/12/2023                GFRNONAA                 >60                 05/12/2023                CALCIUM                  9.1                 05/12/2023  ALBUMIN                  4.0                 07/01/2018                GLUCOSE                  89                  05/12/2023                Musculoskeletal   Abdominal  (+) + obese (BMI 35.5)  Peds  Hematology  (+) REFUSES BLOOD PRODUCTS  Anesthesia Other Findings   Reproductive/Obstetrics                             Anesthesia Physical Anesthesia Plan  ASA: 2  Anesthesia Plan: General and Regional   Post-op Pain Management: Precedex, Tylenol PO (pre-op)*, Regional block* and Minimal or no pain  anticipated   Induction: Intravenous  PONV Risk Score and Plan: Treatment may vary due to age or medical condition, Midazolam, Ondansetron, Dexamethasone, TIVA and Propofol infusion  Airway Management Planned: Oral ETT  Additional Equipment: None  Intra-op Plan:   Post-operative Plan:   Informed Consent: I have reviewed the patients History and Physical, chart, labs and discussed the procedure including the risks, benefits and alternatives for the proposed anesthesia with the patient or authorized representative who has indicated his/her understanding and acceptance.     Dental advisory given  Plan Discussed with:   Anesthesia Plan Comments: (TIVA GA w R adductor +  Popliteal)        Anesthesia Quick Evaluation

## 2023-05-12 NOTE — Progress Notes (Signed)
Surgical soap given with instructions, pt verbalized understanding.

## 2023-05-13 ENCOUNTER — Ambulatory Visit (HOSPITAL_BASED_OUTPATIENT_CLINIC_OR_DEPARTMENT_OTHER): Payer: Self-pay | Admitting: Certified Registered Nurse Anesthetist

## 2023-05-13 ENCOUNTER — Ambulatory Visit (HOSPITAL_BASED_OUTPATIENT_CLINIC_OR_DEPARTMENT_OTHER)
Admission: RE | Admit: 2023-05-13 | Discharge: 2023-05-13 | Disposition: A | Discharge status: Home / Self Care | Payer: 59 | Attending: Orthopaedic Surgery | Admitting: Orthopaedic Surgery

## 2023-05-13 ENCOUNTER — Encounter (HOSPITAL_BASED_OUTPATIENT_CLINIC_OR_DEPARTMENT_OTHER)
Admission: RE | Disposition: A | Discharge status: Home / Self Care | Payer: Self-pay | Source: Home / Self Care | Attending: Orthopaedic Surgery

## 2023-05-13 ENCOUNTER — Other Ambulatory Visit: Payer: Self-pay

## 2023-05-13 ENCOUNTER — Encounter (HOSPITAL_BASED_OUTPATIENT_CLINIC_OR_DEPARTMENT_OTHER): Payer: Self-pay | Admitting: Orthopaedic Surgery

## 2023-05-13 DIAGNOSIS — Z419 Encounter for procedure for purposes other than remedying health state, unspecified: Secondary | ICD-10-CM

## 2023-05-13 DIAGNOSIS — G8918 Other acute postprocedural pain: Secondary | ICD-10-CM | POA: Diagnosis not present

## 2023-05-13 DIAGNOSIS — M25371 Other instability, right ankle: Secondary | ICD-10-CM

## 2023-05-13 DIAGNOSIS — Z87891 Personal history of nicotine dependence: Secondary | ICD-10-CM | POA: Insufficient documentation

## 2023-05-13 DIAGNOSIS — Z5986 Financial insecurity: Secondary | ICD-10-CM | POA: Insufficient documentation

## 2023-05-13 DIAGNOSIS — Z90722 Acquired absence of ovaries, bilateral: Secondary | ICD-10-CM | POA: Insufficient documentation

## 2023-05-13 HISTORY — PX: REPAIR OF PERONEUS BREVIS TENDON: SHX6215

## 2023-05-13 HISTORY — DX: Nausea with vomiting, unspecified: Z98.890

## 2023-05-13 HISTORY — PX: ANKLE ARTHROSCOPY: SHX545

## 2023-05-13 HISTORY — DX: Other specified postprocedural states: R11.2

## 2023-05-13 HISTORY — DX: Nausea with vomiting, unspecified: R11.2

## 2023-05-13 LAB — NO BLOOD PRODUCTS

## 2023-05-13 LAB — POCT PREGNANCY, URINE: Preg Test, Ur: NEGATIVE

## 2023-05-13 SURGERY — ARTHROSCOPY, ANKLE
Anesthesia: Regional | Site: Ankle | Laterality: Right

## 2023-05-13 MED ORDER — DEXMEDETOMIDINE HCL IN NACL 80 MCG/20ML IV SOLN
INTRAVENOUS | Status: DC | PRN
Start: 2023-05-13 — End: 2023-05-13
  Administered 2023-05-13 (×4): 4 ug via INTRAVENOUS
  Administered 2023-05-13: 12 ug via INTRAVENOUS
  Administered 2023-05-13: 4 ug via INTRAVENOUS
  Administered 2023-05-13: 8 ug via INTRAVENOUS

## 2023-05-13 MED ORDER — OXYCODONE HCL 5 MG/5ML PO SOLN: 5.0000 mg | Freq: Once | ORAL | Status: DC | PRN

## 2023-05-13 MED ORDER — PROPOFOL 500 MG/50ML IV EMUL
INTRAVENOUS | Status: DC | PRN
Start: 2023-05-13 — End: 2023-05-13
  Administered 2023-05-13 (×2): 75 ug/kg/min via INTRAVENOUS

## 2023-05-13 MED ORDER — FENTANYL CITRATE (PF) 100 MCG/2ML IJ SOLN
INTRAMUSCULAR | Status: AC
  Filled 2023-05-13: qty 2

## 2023-05-13 MED ORDER — DEXMEDETOMIDINE HCL IN NACL 80 MCG/20ML IV SOLN
INTRAVENOUS | Status: AC
  Filled 2023-05-13: qty 20

## 2023-05-13 MED ORDER — HYDROMORPHONE HCL 1 MG/ML IJ SOLN: 0.2500 mg | INTRAMUSCULAR | Status: DC | PRN

## 2023-05-13 MED ORDER — VANCOMYCIN HCL 500 MG IV SOLR
INTRAVENOUS | Status: DC | PRN
  Administered 2023-05-13: 500 mg via TOPICAL

## 2023-05-13 MED ORDER — DEXAMETHASONE SODIUM PHOSPHATE 10 MG/ML IJ SOLN
INTRAMUSCULAR | Status: DC | PRN
Start: 2023-05-13 — End: 2023-05-13
  Administered 2023-05-13: 5 mg via INTRAVENOUS

## 2023-05-13 MED ORDER — MIDAZOLAM HCL 2 MG/2ML IJ SOLN
INTRAMUSCULAR | Status: DC | PRN
Start: 2023-05-13 — End: 2023-05-13
  Administered 2023-05-13 (×2): 1 mg via INTRAVENOUS

## 2023-05-13 MED ORDER — ARTIFICIAL TEARS OPHTHALMIC OINT
TOPICAL_OINTMENT | OPHTHALMIC | Status: AC
  Filled 2023-05-13: qty 3.5

## 2023-05-13 MED ORDER — CEFAZOLIN SODIUM-DEXTROSE 2-4 GM/100ML-% IV SOLN
INTRAVENOUS | Status: AC
  Filled 2023-05-13: qty 100

## 2023-05-13 MED ORDER — LACTATED RINGERS IV SOLN: INTRAVENOUS | Status: DC

## 2023-05-13 MED ORDER — KETOROLAC TROMETHAMINE 30 MG/ML IJ SOLN: 30.0000 mg | Freq: Once | INTRAMUSCULAR | Status: DC | PRN

## 2023-05-13 MED ORDER — MIDAZOLAM HCL 2 MG/2ML IJ SOLN
2.0000 mg | Freq: Once | INTRAMUSCULAR | Status: AC
  Administered 2023-05-13: 2 mg via INTRAVENOUS

## 2023-05-13 MED ORDER — ARTIFICIAL TEARS OPHTHALMIC OINT
TOPICAL_OINTMENT | OPHTHALMIC | Status: DC | PRN
Start: 2023-05-13 — End: 2023-05-13
  Administered 2023-05-13: 1 via OPHTHALMIC

## 2023-05-13 MED ORDER — ROCURONIUM BROMIDE 100 MG/10ML IV SOLN
INTRAVENOUS | Status: DC | PRN
Start: 2023-05-13 — End: 2023-05-13
  Administered 2023-05-13: 60 mg via INTRAVENOUS

## 2023-05-13 MED ORDER — PROPOFOL 500 MG/50ML IV EMUL
INTRAVENOUS | Status: AC
  Filled 2023-05-13: qty 50

## 2023-05-13 MED ORDER — OXYCODONE HCL 5 MG PO TABS: 5.0000 mg | ORAL_TABLET | Freq: Once | ORAL | Status: DC | PRN

## 2023-05-13 MED ORDER — LIDOCAINE 2% (20 MG/ML) 5 ML SYRINGE
INTRAMUSCULAR | Status: AC
  Filled 2023-05-13: qty 5

## 2023-05-13 MED ORDER — ONDANSETRON HCL 4 MG/2ML IJ SOLN
INTRAMUSCULAR | Status: AC
  Filled 2023-05-13: qty 2

## 2023-05-13 MED ORDER — VANCOMYCIN HCL 500 MG IV SOLR
INTRAVENOUS | Status: AC
  Filled 2023-05-13: qty 10

## 2023-05-13 MED ORDER — ONDANSETRON HCL 4 MG/2ML IJ SOLN: 4.0000 mg | Freq: Once | INTRAMUSCULAR | Status: DC | PRN

## 2023-05-13 MED ORDER — SUGAMMADEX SODIUM 200 MG/2ML IV SOLN
INTRAVENOUS | Status: DC | PRN
Start: 2023-05-13 — End: 2023-05-13

## 2023-05-13 MED ORDER — ONDANSETRON HCL 4 MG/2ML IJ SOLN
INTRAMUSCULAR | Status: DC | PRN
Start: 2023-05-13 — End: 2023-05-13
  Administered 2023-05-13: 4 mg via INTRAVENOUS

## 2023-05-13 MED ORDER — PROPOFOL 10 MG/ML IV BOLUS
INTRAVENOUS | Status: DC | PRN
Start: 2023-05-13 — End: 2023-05-13
  Administered 2023-05-13: 25 mg via INTRAVENOUS
  Administered 2023-05-13: 200 mg via INTRAVENOUS
  Administered 2023-05-13 (×2): 50 mg via INTRAVENOUS

## 2023-05-13 MED ORDER — GLYCOPYRROLATE 0.2 MG/ML IJ SOLN
INTRAMUSCULAR | Status: AC
  Filled 2023-05-13: qty 2

## 2023-05-13 MED ORDER — MIDAZOLAM HCL 2 MG/2ML IJ SOLN
INTRAMUSCULAR | Status: AC
  Filled 2023-05-13: qty 2

## 2023-05-13 MED ORDER — DEXAMETHASONE SODIUM PHOSPHATE 10 MG/ML IJ SOLN
INTRAMUSCULAR | Status: AC
  Filled 2023-05-13: qty 1

## 2023-05-13 MED ORDER — SUGAMMADEX SODIUM 200 MG/2ML IV SOLN
INTRAVENOUS | Status: DC | PRN
Start: 2023-05-13 — End: 2023-05-13
  Administered 2023-05-13: 200 mg via INTRAVENOUS

## 2023-05-13 MED ORDER — FENTANYL CITRATE (PF) 100 MCG/2ML IJ SOLN
INTRAMUSCULAR | Status: DC | PRN
Start: 2023-05-13 — End: 2023-05-13
  Administered 2023-05-13: 100 ug via INTRAVENOUS

## 2023-05-13 MED ORDER — BUPIVACAINE HCL (PF) 0.25 % IJ SOLN
INTRAMUSCULAR | Status: AC
  Filled 2023-05-13: qty 30

## 2023-05-13 MED ORDER — GLYCOPYRROLATE 0.2 MG/ML IJ SOLN
INTRAMUSCULAR | Status: DC | PRN
Start: 2023-05-13 — End: 2023-05-13
  Administered 2023-05-13: .1 mg via INTRAVENOUS

## 2023-05-13 MED ORDER — CEFAZOLIN SODIUM-DEXTROSE 2-4 GM/100ML-% IV SOLN
2.0000 g | INTRAVENOUS | Status: AC
  Administered 2023-05-13: 2 g via INTRAVENOUS

## 2023-05-13 MED ORDER — FENTANYL CITRATE (PF) 100 MCG/2ML IJ SOLN
100.0000 ug | Freq: Once | INTRAMUSCULAR | Status: AC
  Administered 2023-05-13: 100 ug via INTRAVENOUS

## 2023-05-13 SURGICAL SUPPLY — 75 items
ANCH SUT 0 2.3 FT 10X3.5 (Anchor) IMPLANT
ANCH SUT NDL DX FBRTK STRL LF (Anchor) ×2 IMPLANT
ANCH SUT NDL STRL LF DX FBRTK (Anchor) ×2 IMPLANT
ANCHOR SUT FBRTK 1.3 SUTTAP (Anchor) IMPLANT
ANCHOR SUT FIBERTAK DX DBL (Anchor) IMPLANT
APL PRP STRL LF DISP 70% ISPRP (MISCELLANEOUS) ×2
BANDAGE ESMARK 6X9 LF (GAUZE/BANDAGES/DRESSINGS) IMPLANT
BLADE SURG 15 STRL LF DISP TIS (BLADE) ×2 IMPLANT
BLADE SURG 15 STRL SS (BLADE) ×2
BNDG CMPR 5X4 KNIT ELC UNQ LF (GAUZE/BANDAGES/DRESSINGS) ×2
BNDG CMPR 9X6 STRL LF SNTH (GAUZE/BANDAGES/DRESSINGS)
BNDG CMPR MED 10X6 ELC LF (GAUZE/BANDAGES/DRESSINGS) ×2
BNDG ELASTIC 4INX 5YD STR LF (GAUZE/BANDAGES/DRESSINGS) ×2 IMPLANT
BNDG ELASTIC 6X10 VLCR STRL LF (GAUZE/BANDAGES/DRESSINGS) ×2 IMPLANT
BNDG ESMARK 6X9 LF (GAUZE/BANDAGES/DRESSINGS)
BNDG GAUZE DERMACEA FLUFF 4 (GAUZE/BANDAGES/DRESSINGS) ×2 IMPLANT
BNDG GZE DERMACEA 4 6PLY (GAUZE/BANDAGES/DRESSINGS) ×2
BOOT STEPPER DURA LG (SOFTGOODS) IMPLANT
BOOT STEPPER DURA MED (SOFTGOODS) IMPLANT
BOOT STEPPER DURA SM (SOFTGOODS) IMPLANT
BURR OVAL 8 FLU 4.0X13 (MISCELLANEOUS) IMPLANT
CHLORAPREP W/TINT 26 (MISCELLANEOUS) ×2 IMPLANT
COTTON STERILE ROLL (GAUZE/BANDAGES/DRESSINGS) IMPLANT
CUFF TOURN SGL QUICK 34 (TOURNIQUET CUFF)
CUFF TRNQT CYL 34X4.125X (TOURNIQUET CUFF) IMPLANT
DISSECTOR 3.8MM X 13CM (MISCELLANEOUS) IMPLANT
DRAPE EXTREMITY T 121X128X90 (DISPOSABLE) ×2 IMPLANT
DRAPE OEC MINIVIEW 54X84 (DRAPES) IMPLANT
DRAPE U-SHAPE 47X51 STRL (DRAPES) ×2 IMPLANT
ELECT REM PT RETURN 9FT ADLT (ELECTROSURGICAL) ×2
ELECTRODE REM PT RTRN 9FT ADLT (ELECTROSURGICAL) ×2 IMPLANT
EXCALIBUR 3.8MM X 13CM (MISCELLANEOUS) IMPLANT
FACESHIELD WRAPAROUND (MASK) IMPLANT
FACESHIELD WRAPAROUND OR TEAM (MASK) ×2 IMPLANT
GAUZE PAD ABD 8X10 STRL (GAUZE/BANDAGES/DRESSINGS) ×4 IMPLANT
GAUZE SPONGE 4X4 12PLY STRL (GAUZE/BANDAGES/DRESSINGS) ×2 IMPLANT
GAUZE XEROFORM 1X8 LF (GAUZE/BANDAGES/DRESSINGS) ×2 IMPLANT
GLOVE BIOGEL PI IND STRL 8 (GLOVE) ×2 IMPLANT
GLOVE SURG SS PI 7.5 STRL IVOR (GLOVE) ×2 IMPLANT
GOWN STRL REUS W/ TWL LRG LVL3 (GOWN DISPOSABLE) ×2 IMPLANT
GOWN STRL REUS W/ TWL XL LVL3 (GOWN DISPOSABLE) ×4 IMPLANT
GOWN STRL REUS W/TWL LRG LVL3 (GOWN DISPOSABLE) ×2
GOWN STRL REUS W/TWL XL LVL3 (GOWN DISPOSABLE) ×4
IV NS IRRIG 3000ML ARTHROMATIC (IV SOLUTION) ×2 IMPLANT
KIT ARTHRO SURG NANOEC TR (ORTHOPEDIC DISPOSABLE SUPPLIES) IMPLANT
KIT FIBERTAK DX 1.6 DISP (KITS) IMPLANT
MANIFOLD NEPTUNE II (INSTRUMENTS) ×2 IMPLANT
NS IRRIG 1000ML POUR BTL (IV SOLUTION) IMPLANT
PACK ARTHROSCOPY DSU (CUSTOM PROCEDURE TRAY) ×2 IMPLANT
PACK BASIN DAY SURGERY FS (CUSTOM PROCEDURE TRAY) ×2 IMPLANT
PAD CAST 4YDX4 CTTN HI CHSV (CAST SUPPLIES) ×2 IMPLANT
PADDING CAST COTTON 4X4 STRL (CAST SUPPLIES) ×2
PADDING CAST COTTON 6X4 STRL (CAST SUPPLIES) ×2 IMPLANT
PENCIL SMOKE EVACUATOR (MISCELLANEOUS) IMPLANT
SANITIZER HAND PURELL FF 515ML (MISCELLANEOUS) ×2 IMPLANT
SHAVER DISSECTOR 3.0 (BURR) IMPLANT
SHAVER SABRE 2.0 (BURR) IMPLANT
SHEATH SCOPE HI FLOW (MISCELLANEOUS) IMPLANT
SLEEVE SCD COMPRESS KNEE MED (STOCKING) ×2 IMPLANT
SPLINT PLASTER CAST FAST 5X30 (CAST SUPPLIES) IMPLANT
SPONGE T-LAP 18X18 ~~LOC~~+RFID (SPONGE) ×2 IMPLANT
STOCKINETTE 6 STRL (DRAPES) ×2 IMPLANT
STRAP ANKLE FOOT DISTRACTOR (ORTHOPEDIC SUPPLIES) ×2 IMPLANT
SUCTION TUBE FRAZIER 10FR DISP (SUCTIONS) ×2 IMPLANT
SUT ETHILON 2 0 FS 18 (SUTURE) ×2 IMPLANT
SUT VIC AB 2-0 CT1 27 (SUTURE)
SUT VIC AB 2-0 CT1 TAPERPNT 27 (SUTURE) IMPLANT
SUT VIC AB 3-0 SH 27 (SUTURE)
SUT VIC AB 3-0 SH 27X BRD (SUTURE) IMPLANT
SYR BULB EAR ULCER 3OZ GRN STR (SYRINGE) IMPLANT
TOWEL GREEN STERILE FF (TOWEL DISPOSABLE) ×2 IMPLANT
TUBE CONNECTING 20X1/4 (TUBING) IMPLANT
TUBING ARTHROSCOPY IRRIG 16FT (MISCELLANEOUS) ×2 IMPLANT
WAND ABLATOR APOLLO I90 (BUR) IMPLANT
WATER STERILE IRR 1000ML POUR (IV SOLUTION) ×2 IMPLANT

## 2023-05-13 NOTE — Anesthesia Postprocedure Evaluation (Signed)
Anesthesia Post Note  Patient: AZIA TARKINGTON  Procedure(s) Performed: ANKLE ARTHROSCOPY  debridement (Right: Ankle) right lateral ankle stabilization and  REPAIR OF PERONEAL TENDON DEBRIDEMENT (Right)     Patient location during evaluation: PACU Anesthesia Type: Regional and General Level of consciousness: awake and alert Pain management: pain level controlled Vital Signs Assessment: post-procedure vital signs reviewed and stable Respiratory status: spontaneous breathing, nonlabored ventilation, respiratory function stable and patient connected to nasal cannula oxygen Cardiovascular status: blood pressure returned to baseline and stable Postop Assessment: no apparent nausea or vomiting Anesthetic complications: no   No notable events documented.  Last Vitals:  Vitals:   05/13/23 0915 05/13/23 0927  BP: (!) 89/59 (!) 93/59  Pulse: 68   Resp: 13 16  Temp:  36.5 C  SpO2: 97% 97%    Last Pain:  Vitals:   05/13/23 0927  TempSrc:   PainSc: 1    Pain Goal: Patients Stated Pain Goal: 4 (05/13/23 0927)                 Trevor Iha

## 2023-05-13 NOTE — Transfer of Care (Signed)
Immediate Anesthesia Transfer of Care Note  Patient: Natalie Foster  Procedure(s) Performed: ANKLE ARTHROSCOPY  debridement (Right: Ankle) right lateral ankle stabilization and  REPAIR OF PERONEAL TENDON DEBRIDEMENT (Right)  Patient Location: PACU  Anesthesia Type:General and Regional  Level of Consciousness: awake, oriented, and patient cooperative  Airway & Oxygen Therapy: Patient Spontanous Breathing and Patient connected to face mask oxygen  Post-op Assessment: Report given to RN, Post -op Vital signs reviewed and stable, and Patient able to stick tongue midline  Post vital signs: Reviewed and stable  Last Vitals:  Vitals Value Taken Time  BP 151/122 05/13/23 0906  Temp    Pulse 70 05/13/23 0907  Resp 19 05/13/23 0907  SpO2 98 % 05/13/23 0907  Vitals shown include unfiled device data.  Last Pain:  Vitals:   05/13/23 0647  TempSrc: Oral  PainSc: 2       Patients Stated Pain Goal: 5 (05/13/23 1610)  Complications: No notable events documented.

## 2023-05-13 NOTE — H&P (Signed)
H&P Update:  -History and Physical Reviewed  -Patient has been re-examined  -No change in the plan of care  -The risks and benefits were presented and reviewed. The risks due to arthroscopy, hardware/suture failure and/or irritation, new/persistent infection, stiffness, nerve/vessel/tendon injury or rerupture of repaired tendon, nonunion/malunion, allograft usage, wound healing issues, development of arthritis, failure of this surgery, possibility of delayed definitive surgery, need for further surgery, thromboembolic events, anesthesia/medical complications, amputation, death among others were discussed. The patient acknowledged the explanation, agreed to proceed with the plan and a consent was signed.  Natalie Foster

## 2023-05-13 NOTE — Anesthesia Procedure Notes (Signed)
Anesthesia Regional Block: Adductor canal block   Pre-Anesthetic Checklist: , timeout performed,  Correct Patient, Correct Site, Correct Laterality,  Correct Procedure, Correct Position, site marked,  Risks and benefits discussed,  Surgical consent,  Pre-op evaluation,  At surgeon's request and post-op pain management  Laterality: Lower and Right  Prep: chloraprep       Needles:  Injection technique: Single-shot  Needle Type: Echogenic Needle     Needle Length: 9cm  Needle Gauge: 22     Additional Needles:   Procedures:,,,, ultrasound used (permanent image in chart),,    Narrative:  Start time: 05/13/2023 7:08 AM End time: 05/13/2023 7:11 AM Injection made incrementally with aspirations every 5 mL.  Performed by: Personally  Anesthesiologist: Trevor Iha, MD  Additional Notes: Block assessed prior to surgery. Pt tolerated procedure well.

## 2023-05-13 NOTE — Anesthesia Procedure Notes (Signed)
Anesthesia Regional Block: Popliteal block   Pre-Anesthetic Checklist: , timeout performed,  Correct Patient, Correct Site, Correct Laterality,  Correct Procedure, Correct Position, site marked,  Risks and benefits discussed,  Pre-op evaluation,  At surgeon's request and post-op pain management  Laterality: Lower and Right  Prep: Maximum Sterile Barrier Precautions used, chloraprep       Needles:  Injection technique: Single-shot  Needle Type: Echogenic Needle     Needle Length: 9cm  Needle Gauge: 21     Additional Needles:   Procedures:,,,, ultrasound used (permanent image in chart),,    Narrative:  Start time: 05/13/2023 7:01 AM End time: 05/13/2023 7:07 AM Injection made incrementally with aspirations every 5 mL.  Performed by: Personally  Anesthesiologist: Trevor Iha, MD  Additional Notes: Block assessed. Patient tolerated procedure well.

## 2023-05-13 NOTE — Progress Notes (Signed)
Assisted Dr. Richardson Landry with right, adductor canal and popliteal, ultrasound guided block. Side rails up, monitors on throughout procedure. See vital signs in flow sheet. Tolerated Procedure well.

## 2023-05-13 NOTE — Anesthesia Procedure Notes (Signed)
Procedure Name: Intubation Date/Time: 05/13/2023 7:45 AM  Performed by: Salomon Mast, CRNAPre-anesthesia Checklist: Patient identified, Emergency Drugs available, Suction available and Patient being monitored Patient Re-evaluated:Patient Re-evaluated prior to induction Oxygen Delivery Method: Circle system utilized Preoxygenation: Pre-oxygenation with 100% oxygen Induction Type: IV induction Ventilation: Mask ventilation without difficulty Laryngoscope Size: Mac and 4 Grade View: Grade I Tube type: Oral Tube size: 7.0 mm Number of attempts: 1 Airway Equipment and Method: Patient positioned with wedge pillow Placement Confirmation: ETT inserted through vocal cords under direct vision Secured at: 23 cm Tube secured with: Tape Dental Injury: Teeth and Oropharynx as per pre-operative assessment

## 2023-05-14 ENCOUNTER — Encounter (HOSPITAL_BASED_OUTPATIENT_CLINIC_OR_DEPARTMENT_OTHER): Payer: Self-pay | Admitting: Orthopaedic Surgery

## 2023-05-14 NOTE — Op Note (Signed)
05/13/2023  10:15 PM   PATIENT: Natalie Foster  37 y.o. female  MRN: [id number]   PRE-OPERATIVE DIAGNOSIS:   Right lateral ankle instability   POST-OPERATIVE DIAGNOSIS:   Same   PROCEDURE: 1] Right ankle arthroscopic assisted debridement 2] Right lateral ankle stabilization Toniann Ket) 3] Right peroneal tendon debridement and tenosynovectomy   SURGEON:  Netta Cedars, MD   ASSISTANT: None   ANESTHESIA: General, regional   EBL: Minimal   TOURNIQUET:    Total Tourniquet Time Documented: Thigh (Right) - 55 minutes Total: Thigh (Right) - 55 minutes    COMPLICATIONS: None apparent   DISPOSITION: Extubated, awake and stable to recovery.   INDICATION FOR PROCEDURE: The patient presented with above diagnosis.  We discussed the diagnosis, alternative treatment options, risks and benefits of the above surgical intervention, as well as alternative non-operative treatments. All questions/concerns were addressed and the patient/family demonstrated appropriate understanding of the diagnosis, the procedure, the postoperative course, and overall prognosis. The patient wished to proceed with surgical intervention and signed an informed surgical consent as such, in each others presence prior to surgery.   PROCEDURE IN DETAIL: After preoperative consent was obtained and the correct operative site was identified, the patient was brought to the operating room supine on stretcher and transferred onto operating table. General anesthesia was induced. Preoperative antibiotics were administered. Surgical timeout was taken. The patient was then positioned supine with an ipsilateral hip bump. The operative lower extremity was prepped and draped in standard sterile fashion with a tourniquet around the thigh. The extremity was exsanguinated and the tourniquet was inflated to 275 mmHg.   Routine evaluation of the ankle joint demonstrated gross lateral laxity with drawer testing. Full  dorsiflexion as well as plantarflexion was possible.   We began by insufflating the ankle joint thru anteromedial approach. The anteromedial portal was carefully established medial to the tibialis anterior tendon. The arthroscopic trochar with blunt was inserted and then camera placed. There was excellent visualization of the joint and routine diagnostic ankle arthroscopy was performed. Of note, there was mild synovitis throughout the joint noted. Mild chondral changes in the tibiotalar joint surfaces. No loose bodies were encountered and no anterior ankle impingement was identified on max dorsiflexion. The deltoid and syndesmosis ligaments were stressed and noted to be stable. Arthroscopic assisted debridement of the ankle joint was performed.    A standard curvilinear approach was made over the lateral ankle ligament complex and the distal lateral malleolus.    We began by windowing the approach posteriorly to access the peroneal tendons. The sheath was incised and tenosynovial fluid was readily evacuated. We then sequentially evaluated both the peroneus longus and brevis tendons with no tearing noted. We did note extensive tenosynovitis that was debrided thoroughly. There was no instability of peroneal tendons to intraoperative stress testing. The peroneal tendon sheath was close with vicryl.    Dissection was the carried down to the level of the lateral ankle ligament complex. We sharply incised the capsule and the complex just distal to the tip of the fibula leaving a small cuff of tissue for repair after advancement. The proximal flap was elevated carefully off the lateral malleolus. We then used a rongeur to roughen the distal lateral malleolus. Two Arthrex FiberTak anchors were implanted using standard technique in the anatomic footprints of the ATFL and CFL ligaments. These were verified by manual stress to be well seated within bone. The suture needles were sequentially passed through the distal  flap, tied, and then brought  back proximally into the proximal flap were they were then tied.The foot was held in eversion throughout to set appropriate tension and protect the repair. Intraoperative ankle testing demonstrated improved stability of the newly reconstructed lateral ankle ligament complex.   The surgical sites were thoroughly irrigated. The tourniquet was deflated and hemostasis achieved. Betadine and vancomycin powder were applied. The deep layers were closed using 2-0 vicryl. The skin was closed without tension using 2-0 nylon suture.    The leg was cleaned with saline and sterile dressings with gauze were applied. A well padded bulky short leg splint was applied. The patient was awakened from anesthesia and transported to the recovery room in stable condition.      FOLLOW UP PLAN: -transfer to PACU, then home -strict NWB operative extremity, maximum elevation -maintain short leg splint until follow up -DVT ppx: Aspirin 81 mg twice daily while NWB -follow up as outpatient within 7-10 days for wound check with exchange of short leg splint to short leg cast -sutures out in 2-3 weeks in outpatient office   RADIOGRAPHS: None   Netta Cedars [facility]

## 2023-05-21 DIAGNOSIS — Z4889 Encounter for other specified surgical aftercare: Secondary | ICD-10-CM | POA: Diagnosis not present

## 2023-05-21 DIAGNOSIS — S93491D Sprain of other ligament of right ankle, subsequent encounter: Secondary | ICD-10-CM | POA: Diagnosis not present

## 2023-05-21 DIAGNOSIS — S93491A Sprain of other ligament of right ankle, initial encounter: Secondary | ICD-10-CM | POA: Diagnosis not present

## 2023-06-02 DIAGNOSIS — F900 Attention-deficit hyperactivity disorder, predominantly inattentive type: Secondary | ICD-10-CM | POA: Diagnosis not present

## 2023-06-02 DIAGNOSIS — F411 Generalized anxiety disorder: Secondary | ICD-10-CM | POA: Diagnosis not present

## 2023-06-30 DIAGNOSIS — F411 Generalized anxiety disorder: Secondary | ICD-10-CM | POA: Diagnosis not present

## 2023-06-30 DIAGNOSIS — F900 Attention-deficit hyperactivity disorder, predominantly inattentive type: Secondary | ICD-10-CM | POA: Diagnosis not present

## 2023-07-06 DIAGNOSIS — M25671 Stiffness of right ankle, not elsewhere classified: Secondary | ICD-10-CM | POA: Diagnosis not present

## 2023-07-06 DIAGNOSIS — M25571 Pain in right ankle and joints of right foot: Secondary | ICD-10-CM | POA: Diagnosis not present

## 2023-07-10 DIAGNOSIS — M25571 Pain in right ankle and joints of right foot: Secondary | ICD-10-CM | POA: Diagnosis not present

## 2023-07-10 DIAGNOSIS — M25671 Stiffness of right ankle, not elsewhere classified: Secondary | ICD-10-CM | POA: Diagnosis not present

## 2023-07-23 DIAGNOSIS — M25671 Stiffness of right ankle, not elsewhere classified: Secondary | ICD-10-CM | POA: Diagnosis not present

## 2023-07-23 DIAGNOSIS — M25571 Pain in right ankle and joints of right foot: Secondary | ICD-10-CM | POA: Diagnosis not present

## 2023-07-28 DIAGNOSIS — F900 Attention-deficit hyperactivity disorder, predominantly inattentive type: Secondary | ICD-10-CM | POA: Diagnosis not present

## 2023-07-28 DIAGNOSIS — F411 Generalized anxiety disorder: Secondary | ICD-10-CM | POA: Diagnosis not present

## 2023-07-30 DIAGNOSIS — N809 Endometriosis, unspecified: Secondary | ICD-10-CM | POA: Diagnosis not present

## 2023-08-30 DIAGNOSIS — Z419 Encounter for procedure for purposes other than remedying health state, unspecified: Secondary | ICD-10-CM | POA: Diagnosis not present

## 2023-09-19 DIAGNOSIS — Z419 Encounter for procedure for purposes other than remedying health state, unspecified: Secondary | ICD-10-CM | POA: Diagnosis not present

## 2023-09-29 DIAGNOSIS — M7671 Peroneal tendinitis, right leg: Secondary | ICD-10-CM | POA: Diagnosis not present

## 2023-09-29 DIAGNOSIS — S93491A Sprain of other ligament of right ankle, initial encounter: Secondary | ICD-10-CM | POA: Diagnosis not present

## 2023-10-06 DIAGNOSIS — F900 Attention-deficit hyperactivity disorder, predominantly inattentive type: Secondary | ICD-10-CM | POA: Diagnosis not present

## 2023-10-06 DIAGNOSIS — F411 Generalized anxiety disorder: Secondary | ICD-10-CM | POA: Diagnosis not present

## 2023-10-17 DIAGNOSIS — Z419 Encounter for procedure for purposes other than remedying health state, unspecified: Secondary | ICD-10-CM | POA: Diagnosis not present

## 2023-10-29 DIAGNOSIS — F411 Generalized anxiety disorder: Secondary | ICD-10-CM | POA: Diagnosis not present

## 2023-10-29 DIAGNOSIS — F900 Attention-deficit hyperactivity disorder, predominantly inattentive type: Secondary | ICD-10-CM | POA: Diagnosis not present

## 2023-11-05 DIAGNOSIS — E785 Hyperlipidemia, unspecified: Secondary | ICD-10-CM | POA: Diagnosis not present

## 2023-11-05 DIAGNOSIS — Z713 Dietary counseling and surveillance: Secondary | ICD-10-CM | POA: Diagnosis not present

## 2023-11-05 DIAGNOSIS — W57XXXA Bitten or stung by nonvenomous insect and other nonvenomous arthropods, initial encounter: Secondary | ICD-10-CM | POA: Diagnosis not present

## 2023-11-05 DIAGNOSIS — E669 Obesity, unspecified: Secondary | ICD-10-CM | POA: Diagnosis not present

## 2023-11-05 DIAGNOSIS — Z7182 Exercise counseling: Secondary | ICD-10-CM | POA: Diagnosis not present

## 2023-11-12 ENCOUNTER — Other Ambulatory Visit: Payer: Self-pay

## 2023-11-12 ENCOUNTER — Encounter: Payer: Self-pay | Admitting: Emergency Medicine

## 2023-11-12 ENCOUNTER — Ambulatory Visit: Admission: EM | Admit: 2023-11-12 | Discharge: 2023-11-12 | Disposition: A | Discharge status: Home / Self Care

## 2023-11-12 DIAGNOSIS — B029 Zoster without complications: Secondary | ICD-10-CM

## 2023-11-12 MED ORDER — VALACYCLOVIR HCL 1 G PO TABS: 1000.0000 mg | ORAL_TABLET | Freq: Three times a day (TID) | ORAL | 0 refills | Status: AC

## 2023-11-12 NOTE — ED Triage Notes (Signed)
 Pt reports rash to right buttock x1.5 weeks. Pt reports was seen at pcp virtually for similar last week. Pt reports initially thought was insect bite but reports site began to get "scalely". Pt reports was started on abx on Thursday. Has also been using antifungal cream to attempt to help contain spread.   Reports hx of connective tissue disorder but reports "never this big."

## 2023-11-12 NOTE — ED Provider Notes (Signed)
 [facility]    CSN: [id number] Arrival date & time: 11/12/23  1334      History   Chief Complaint Chief Complaint  Patient presents with   Rash    HPI Natalie Foster is a 38 y.o. female.   Patient presenting today with about a week and a half of a rash to the right buttock.  States it started as a small bump that she thought might have been a bug bite but then got increasingly more red, itchy and painful.  She did a virtual visit with her primary care and was given a round of Keflex in case it was an infected bug bite but states that this is not helped.  She is also trying Lotrimin cream with no relief.  States there are now multiple patches in that area and it seems to be even more itchy and painful than previously.  Denies fever, chills, body aches, rashes elsewhere, nausea, vomiting, new products used, new foods tried, new medications.    Past Medical History:  Diagnosis Date   HSP (Henoch-Schonlein purpura) nephritis (HCC)    PONV (postoperative nausea and vomiting)     Patient Active Problem List   Diagnosis Date Noted   Class 1 obesity due to excess calories without serious comorbidity with body mass index (BMI) of 33.0 to 33.9 in adult 11/11/2021   Mixed hyperlipidemia 11/11/2021   Abdominal pain, RLQ (right lower quadrant) 05/11/2018    Past Surgical History:  Procedure Laterality Date   ANKLE ARTHROSCOPY Right 05/13/2023   Procedure: ANKLE ARTHROSCOPY  debridement;  Surgeon: Netta Cedars, MD;  Location: [facility];  Service: Orthopedics;  Laterality: Right;  90 MIN   LAMINOTOMY     LAPAROSCOPIC BILATERAL SALPINGECTOMY Bilateral 07/07/2018   Procedure: LAPAROSCOPIC BILATERAL SALPINGECTOMY;  Surgeon: Lazaro Arms, MD;  Location: [facility];  Service: Gynecology;  Laterality: Bilateral;   LUMBAR DISC SURGERY     MOUTH SURGERY     NO PAST SURGERIES     REPAIR OF PERONEUS BREVIS TENDON Right 05/13/2023   Procedure: right lateral ankle  stabilization and  REPAIR OF PERONEAL TENDON DEBRIDEMENT;  Surgeon: Netta Cedars, MD;  Location: [facility];  Service: Orthopedics;  Laterality: Right;    OB History     Gravida  2   Para  2   Term  2   Preterm      AB      Living  2      SAB      IAB      Ectopic      Multiple      Live Births  1            Home Medications    Prior to Admission medications   Medication Sig Start Date End Date Taking? Authorizing Provider  rizatriptan (MAXALT) 10 MG tablet Take by mouth. 10/22/23  Yes [provider]  valACYclovir (VALTREX) 1000 MG tablet Take 1 tablet (1,000 mg total) by mouth 3 (three) times daily for 7 days. 11/12/23 11/19/23 Yes Particia Nearing, PA-C  WEGOVY 0.25 MG/0.5ML Ivory Broad  11/12/23  Yes [provider]  ALPRAZolam Prudy Feeler) 0.25 MG tablet Take 1 tablet by mouth 3 (three) times daily as needed. 10/07/21   [provider]  cephALEXin (KEFLEX) 500 MG capsule Take 1 capsule every 6 hours by oral route for 14 days.    [provider]  escitalopram (LEXAPRO) 20 MG tablet Take 20 mg  by mouth daily. 10/12/21   [provider]  hydrOXYzine (ATARAX) 25 MG tablet Take 1 tablet by mouth daily as needed.    [provider]  lisdexamfetamine (VYVANSE) 20 MG capsule Take 20 mg by mouth daily.    [provider]  MAGNESIUM-OXIDE PO Take 1 tablet by mouth daily in the afternoon.    [provider]  Multiple Vitamin (MULTIVITAMIN WITH MINERALS) TABS tablet Take 1 tablet by mouth daily.    [provider]  ondansetron (ZOFRAN) 4 MG tablet Take 1 tablet twice a day by oral route as needed for 7 days.    [provider]  oxyCODONE (OXY IR/ROXICODONE) 5 MG immediate release tablet Take 1 tablet every 4-6 hours by oral route for 7 days.    [provider]  SUMAtriptan (IMITREX) 100 MG tablet Take by mouth as needed. 09/26/21   [provider]  Zinc  Acetate, Oral, (ZINC ACETATE PO) Take 10 mg by mouth daily as needed.    [provider]    Family History Family History  Problem Relation Age of Onset   Cancer Mother    Hypertension Mother    Thyroid disease Mother    Cancer Father    Hyperlipidemia Father    Breast cancer Paternal Aunt    Breast cancer Paternal Aunt    Breast cancer Paternal Aunt    Cancer Paternal Grandmother        lung   Stroke Paternal Grandfather    Cancer Paternal Grandfather        lung    Social History Social History   Tobacco Use   Smoking status: Former    Types: Cigarettes   Smokeless tobacco: Never   Tobacco comments:    social smoker in college.  Vaping Use   Vaping status: Never Used  Substance Use Topics   Alcohol use: Yes    Comment: once a week   Drug use: No     Allergies   Cortisone and Pineapple   Review of Systems Review of Systems PER HPI  Physical Exam Triage Vital Signs ED Triage Vitals  Encounter Vitals Group     BP 11/12/23 1421 111/79     Systolic BP Percentile --      Diastolic BP Percentile --      Pulse Rate 11/12/23 1421 94     Resp 11/12/23 1421 20     Temp 11/12/23 1421 98.5 F (36.9 C)     Temp Source 11/12/23 1421 Oral     SpO2 11/12/23 1421 97 %     Weight --      Height --      Head Circumference --      Peak Flow --      Pain Score 11/12/23 1425 2     Pain Loc --      Pain Education --      Exclude from Growth Chart --    No data found.  Updated Vital Signs BP 111/79 (BP Location: Right Arm)   Pulse 94   Temp 98.5 F (36.9 C) (Oral)   Resp 20   SpO2 97%   Visual Acuity Right Eye Distance:   Left Eye Distance:   Bilateral Distance:    Right Eye Near:   Left Eye Near:    Bilateral Near:     Physical Exam Vitals and nursing note reviewed.  Constitutional:      Appearance: Normal appearance. She is not ill-appearing.  HENT:  Head: Atraumatic.  Eyes:     Extraocular Movements: Extraocular movements intact.      Conjunctiva/sclera: Conjunctivae normal.  Cardiovascular:     Rate and Rhythm: Normal rate.  Pulmonary:     Effort: Pulmonary effort is normal.  Musculoskeletal:        General: Normal range of motion.     Cervical back: Normal range of motion and neck supple.  Skin:    General: Skin is warm and dry.     Findings: Rash present.     Comments: Erythematous macular lesions with vesicles and ulcerations in a linear pattern to the right buttock  Neurological:     Mental Status: She is alert and oriented to person, place, and time.  Psychiatric:        Mood and Affect: Mood normal.        Thought Content: Thought content normal.        Judgment: Judgment normal.      UC Treatments / Results  Labs (all labs ordered are listed, but only abnormal results are displayed) Labs Reviewed - No data to display  EKG   Radiology No results found.  Procedures Procedures (including critical care time)  Medications Ordered in UC Medications - No data to display  Initial Impression / Assessment and Plan / UC Course  I have reviewed the triage vital signs and the nursing notes.  Pertinent labs & imaging results that were available during my care of the patient were reviewed by me and considered in my medical decision making (see chart for details).     Consistent with shingles.  Treat with Valtrex, topical care, supportive over-the-counter medications and home care.  Return for worsening symptoms.  Final Clinical Impressions(s) / UC Diagnoses   Final diagnoses:  Herpes zoster without complication   Discharge Instructions   None    ED Prescriptions     Medication Sig Dispense Auth. Provider   valACYclovir (VALTREX) 1000 MG tablet Take 1 tablet (1,000 mg total) by mouth 3 (three) times daily for 7 days. 21 tablet Particia Nearing, [place]      PDMP not reviewed this encounter.   Particia Nearing, [place] 11/12/23 1807

## 2023-11-12 NOTE — ED Notes (Signed)
 Applied two band aids and mupirocin ointment on patients right buttock

## 2023-11-18 DIAGNOSIS — E785 Hyperlipidemia, unspecified: Secondary | ICD-10-CM | POA: Diagnosis not present

## 2023-11-18 DIAGNOSIS — E559 Vitamin D deficiency, unspecified: Secondary | ICD-10-CM | POA: Diagnosis not present

## 2023-11-18 DIAGNOSIS — Z0000 Encounter for general adult medical examination without abnormal findings: Secondary | ICD-10-CM | POA: Diagnosis not present

## 2023-11-28 DIAGNOSIS — Z419 Encounter for procedure for purposes other than remedying health state, unspecified: Secondary | ICD-10-CM | POA: Diagnosis not present

## 2023-12-21 DIAGNOSIS — Z713 Dietary counseling and surveillance: Secondary | ICD-10-CM | POA: Diagnosis not present

## 2023-12-21 DIAGNOSIS — Z7182 Exercise counseling: Secondary | ICD-10-CM | POA: Diagnosis not present

## 2023-12-21 DIAGNOSIS — F909 Attention-deficit hyperactivity disorder, unspecified type: Secondary | ICD-10-CM | POA: Diagnosis not present

## 2023-12-21 DIAGNOSIS — Z0000 Encounter for general adult medical examination without abnormal findings: Secondary | ICD-10-CM | POA: Diagnosis not present

## 2023-12-21 DIAGNOSIS — R21 Rash and other nonspecific skin eruption: Secondary | ICD-10-CM | POA: Diagnosis not present

## 2023-12-21 DIAGNOSIS — F418 Other specified anxiety disorders: Secondary | ICD-10-CM | POA: Diagnosis not present

## 2023-12-21 DIAGNOSIS — R768 Other specified abnormal immunological findings in serum: Secondary | ICD-10-CM | POA: Diagnosis not present

## 2023-12-21 DIAGNOSIS — E669 Obesity, unspecified: Secondary | ICD-10-CM | POA: Diagnosis not present

## 2023-12-28 DIAGNOSIS — Z419 Encounter for procedure for purposes other than remedying health state, unspecified: Secondary | ICD-10-CM | POA: Diagnosis not present

## 2023-12-29 DIAGNOSIS — F411 Generalized anxiety disorder: Secondary | ICD-10-CM | POA: Diagnosis not present

## 2023-12-29 DIAGNOSIS — F900 Attention-deficit hyperactivity disorder, predominantly inattentive type: Secondary | ICD-10-CM | POA: Diagnosis not present

## 2023-12-31 DIAGNOSIS — M359 Systemic involvement of connective tissue, unspecified: Secondary | ICD-10-CM | POA: Diagnosis not present

## 2023-12-31 DIAGNOSIS — M797 Fibromyalgia: Secondary | ICD-10-CM | POA: Diagnosis not present

## 2023-12-31 DIAGNOSIS — E669 Obesity, unspecified: Secondary | ICD-10-CM | POA: Diagnosis not present

## 2023-12-31 DIAGNOSIS — M542 Cervicalgia: Secondary | ICD-10-CM | POA: Diagnosis not present

## 2023-12-31 DIAGNOSIS — R768 Other specified abnormal immunological findings in serum: Secondary | ICD-10-CM | POA: Diagnosis not present

## 2023-12-31 DIAGNOSIS — M51369 Other intervertebral disc degeneration, lumbar region without mention of lumbar back pain or lower extremity pain: Secondary | ICD-10-CM | POA: Diagnosis not present

## 2024-01-10 IMAGING — DX DG ANKLE COMPLETE 3+V*R*
3 series · 3 of 3 positions shown · non-contrast
Comparison: None Available.

CLINICAL DATA: Right ankle pain and swelling after injury

EXAM:
RIGHT ANKLE - COMPLETE 3+ VIEW

[ankle ap]
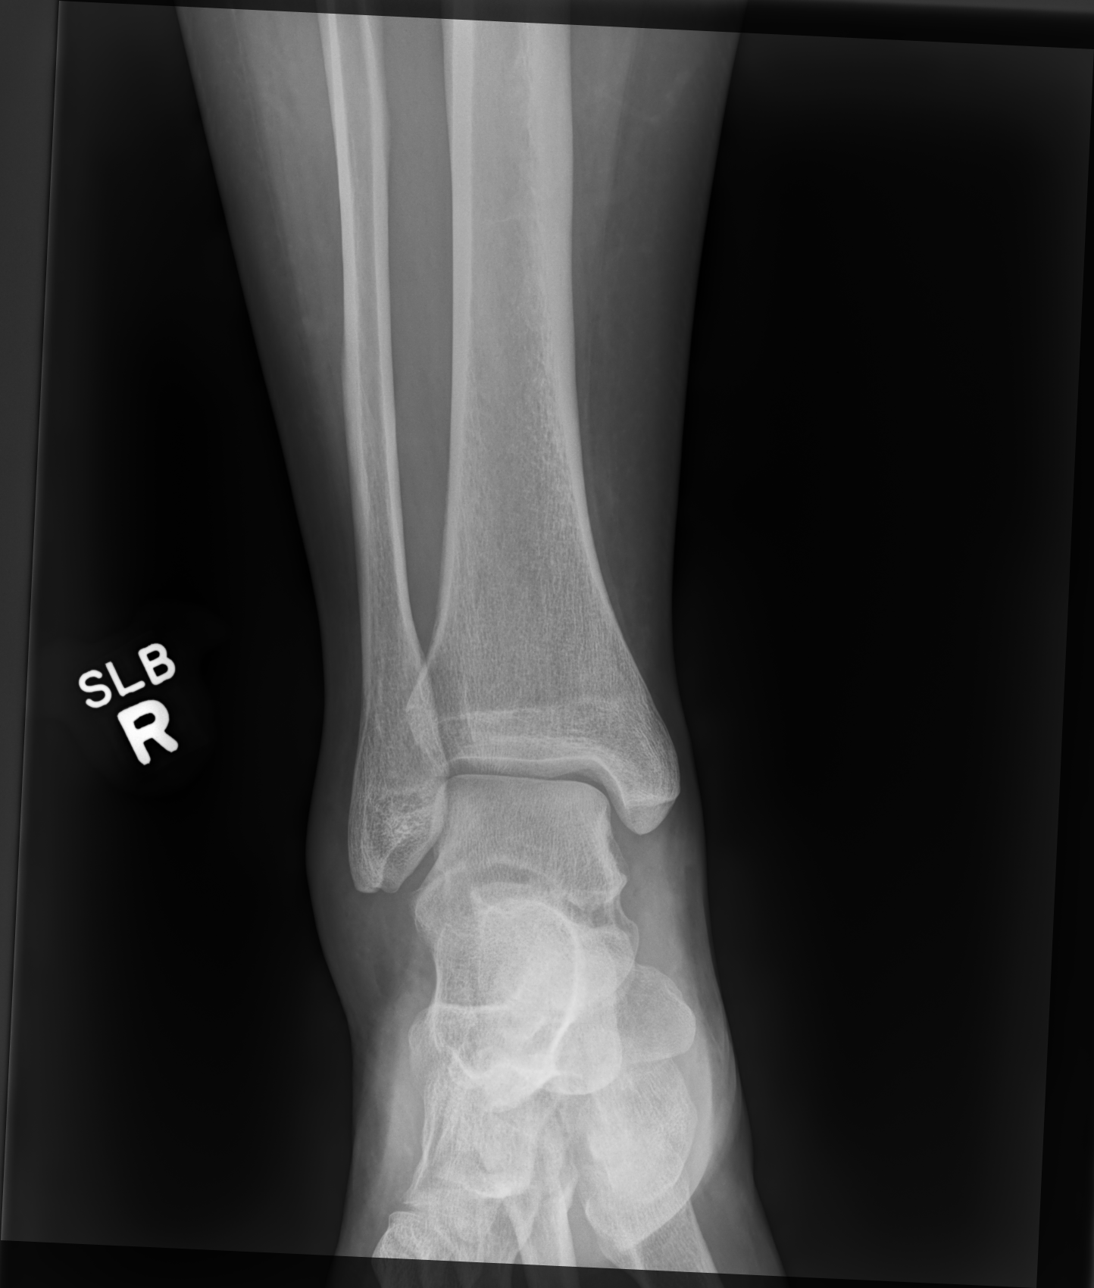

[ankle mlo]
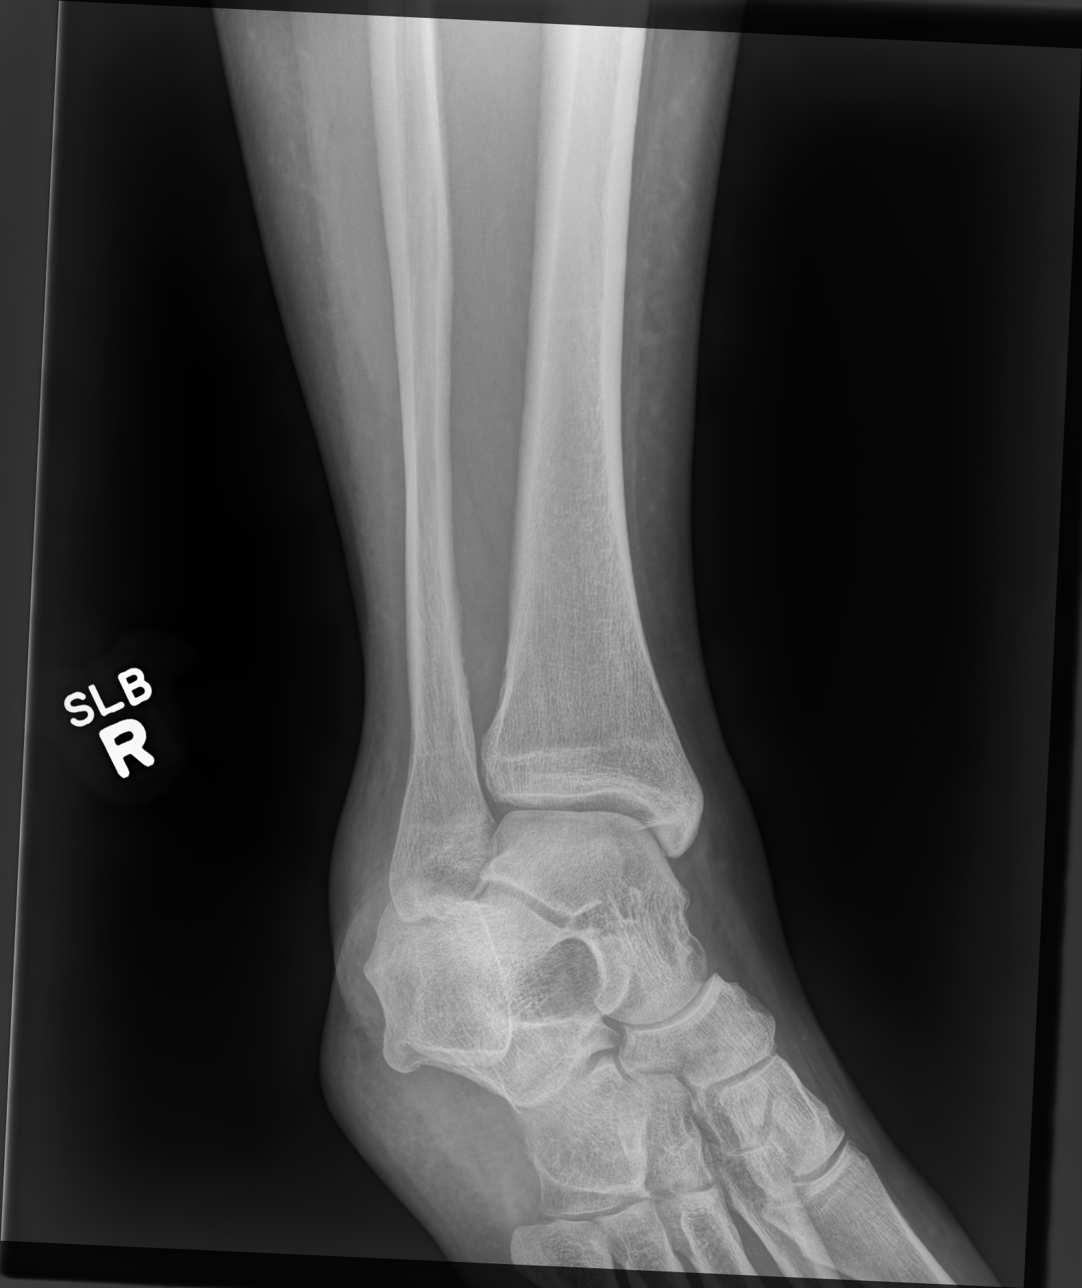

[ankle lat]
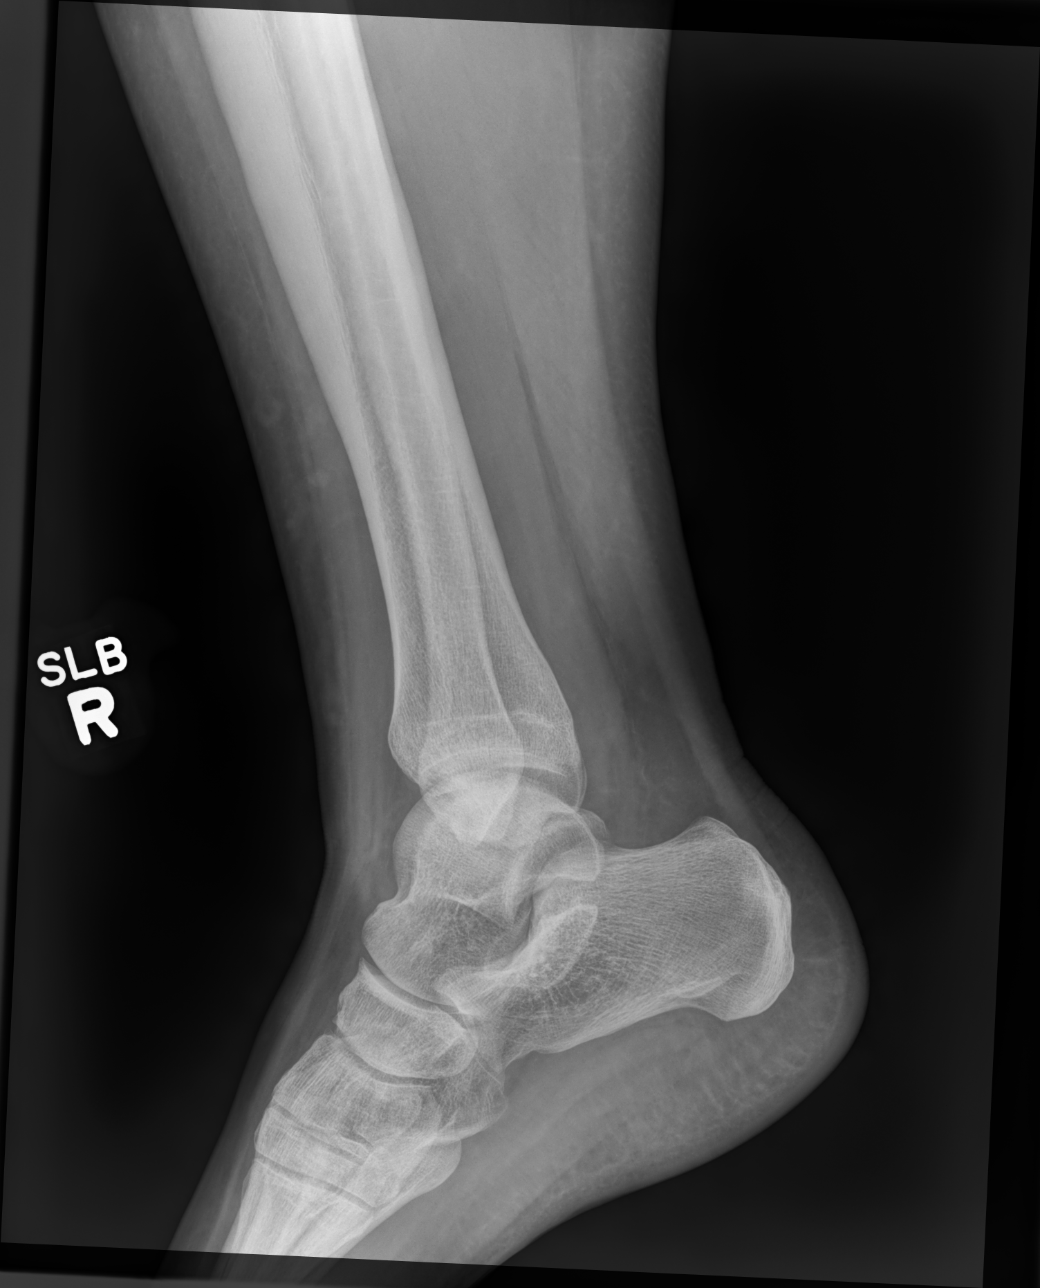

[3 of 3 positions shown; findings below may reference images not displayed]

FINDINGS: There is no evidence of fracture, dislocation, or joint effusion.
There is no evidence of arthropathy or other focal bone abnormality.
Soft tissue swelling overlies the lateral ankle.
IMPRESSION: 1. No fracture or dislocation of the right ankle.
2. Soft tissue swelling over the lateral ankle.

## 2024-01-12 DIAGNOSIS — R2241 Localized swelling, mass and lump, right lower limb: Secondary | ICD-10-CM | POA: Diagnosis not present

## 2024-01-12 DIAGNOSIS — S93491A Sprain of other ligament of right ankle, initial encounter: Secondary | ICD-10-CM | POA: Diagnosis not present

## 2024-01-12 DIAGNOSIS — M7671 Peroneal tendinitis, right leg: Secondary | ICD-10-CM | POA: Diagnosis not present

## 2024-01-21 ENCOUNTER — Ambulatory Visit: Admitting: Adult Health

## 2024-01-21 ENCOUNTER — Encounter: Payer: Self-pay | Admitting: Adult Health

## 2024-01-21 VITALS — BP 110/80 | HR 91 | Ht 66.0 in | Wt 204.0 lb

## 2024-01-21 DIAGNOSIS — L0292 Furuncle, unspecified: Secondary | ICD-10-CM | POA: Insufficient documentation

## 2024-01-21 MED ORDER — SULFAMETHOXAZOLE-TRIMETHOPRIM 800-160 MG PO TABS: 1.0000 | ORAL_TABLET | Freq: Two times a day (BID) | ORAL | 0 refills | Status: DC

## 2024-01-21 NOTE — Progress Notes (Signed)
  Subjective:     Patient ID: Natalie Foster, female   DOB: 08/24/1985, 38 y.o.   MRN: [id number]  HPI Natalie Foster is a 38 year old white female, married,G2P2002 in complaining of vaginal cyst that may have popped in the shower today. It is very tender and she says she has had like 4 this year in vaginal area. First noticed about 3 days ago.     Component Value Date/Time   DIAGPAP  04/13/2023 0831    - Negative for intraepithelial lesion or malignancy (NILM)   HPVHIGH Negative 04/13/2023 0831   ADEQPAP  04/13/2023 0831    Satisfactory for evaluation; transformation zone component PRESENT.   PCP is Deno Flair FNP Review of Systems ?vaginal cyst, that is tender and has popped in shower   Reviewed past medical,surgical, social and family history. Reviewed medications and allergies.  Objective:   Physical Exam BP 110/80 (BP Location: Right Arm, Patient Position: Sitting, Cuff Size: Normal)   Pulse 91   Ht 5\' 6"  (1.676 m)   Wt 204 lb (92.5 kg)   BMI 32.93 kg/m  No periods, had ablation Skin warm and dry.Pelvic: external genitalia is normal in appearance,has 1-2 cm boil right vulva near base, it is draining purulent bloody fluid, culture obtained,when massaged more blood came out, vagina:pink,urethra has no lesions or masses noted, cervix:smooth and bulbous, uterus: normal size, shape and contour, non tender, no masses felt, adnexa: no masses or tenderness noted. Bladder is non tender and no masses felt.    Fall risk is low  Upstream - 01/21/24 1609       Pregnancy Intention Screening   Does the patient want to become pregnant in the next year? No    Does the patient's partner want to become pregnant in the next year? No    Would the patient like to discuss contraceptive options today? No      Contraception Wrap Up   Current Method Female Sterilization    End Method Female Sterilization    Contraception Counseling Provided No            Examination chaperoned by Lorean Rodes  LPN Assessment:     1. Boil (Primary) Boil is draining Culture sent Will rx septra ds 1 bid x 10 days Meds ordered this encounter  Medications   sulfamethoxazole-trimethoprim (BACTRIM DS) 800-160 MG tablet    Sig: Take 1 tablet by mouth 2 (two) times daily. Take 1 bid    Dispense:  20 tablet    Refill:  0    Supervising Provider:   Evalyn Hillier H [[id number]]   Use warm compress and massage, can take tub bath with epsom salt - Wound culture     Plan:     Follow up in 1 week

## 2024-01-22 DIAGNOSIS — L0292 Furuncle, unspecified: Secondary | ICD-10-CM | POA: Diagnosis not present

## 2024-01-26 ENCOUNTER — Other Ambulatory Visit: Payer: Self-pay | Admitting: Adult Health

## 2024-01-26 ENCOUNTER — Ambulatory Visit: Payer: Self-pay | Admitting: Adult Health

## 2024-01-26 DIAGNOSIS — L0292 Furuncle, unspecified: Secondary | ICD-10-CM

## 2024-01-26 LAB — WOUND CULTURE: Organism ID, Bacteria: NONE SEEN

## 2024-01-26 MED ORDER — PENICILLIN V POTASSIUM 500 MG PO TABS: 500.0000 mg | ORAL_TABLET | Freq: Four times a day (QID) | ORAL | 0 refills | Status: AC

## 2024-01-26 NOTE — Progress Notes (Signed)
 Rx Veetid 500 mg 1 qid x 7 days , stop septra  ds

## 2024-01-28 ENCOUNTER — Ambulatory Visit: Admitting: Adult Health

## 2024-01-28 ENCOUNTER — Encounter: Payer: Self-pay | Admitting: Adult Health

## 2024-01-28 VITALS — BP 89/67 | HR 93 | Ht 66.0 in | Wt 204.0 lb

## 2024-01-28 DIAGNOSIS — L0292 Furuncle, unspecified: Secondary | ICD-10-CM

## 2024-01-28 DIAGNOSIS — Z419 Encounter for procedure for purposes other than remedying health state, unspecified: Secondary | ICD-10-CM | POA: Diagnosis not present

## 2024-01-28 NOTE — Progress Notes (Signed)
  Subjective:     Patient ID: Natalie Foster, female   DOB: November 27, 1985, 38 y.o.   MRN: [id number]  HPI Natalie Foster is a 38 year old white female, married, G2P2002 in for follow on boil and is much better, she is still taking Veetids.  She is having hardware removed right ankle, 02/03/24.     Component Value Date/Time   DIAGPAP  04/13/2023 0831    - Negative for intraepithelial lesion or malignancy (NILM)   HPVHIGH Negative 04/13/2023 0831   ADEQPAP  04/13/2023 0831    Satisfactory for evaluation; transformation zone component PRESENT.   PCP is Carliss Chess NP  Review of Systems Boil is better Reviewed past medical,surgical, social and family history. Reviewed medications and allergies.     Objective:   Physical Exam BP (!) 89/67 (BP Location: Left Arm, Patient Position: Sitting, Cuff Size: Large)   Pulse 93   Ht 5' 6 (1.676 m)   Wt 204 lb (92.5 kg)   BMI 32.93 kg/m     Skin warm and dry. External genitalia is normal in appearance, the boil has resolved, right vulva.  Upstream - 01/28/24 1620       Pregnancy Intention Screening   Does the patient want to become pregnant in the next year? No    Does the patient's partner want to become pregnant in the next year? No    Would the patient like to discuss contraceptive options today? No      Contraception Wrap Up   Current Method Female Sterilization    End Method Female Sterilization    Contraception Counseling Provided No         Examination chaperoned by Alphonso Aschoff LPN Assessment:     1. Boil (Primary) Has resolved but finish Veetids, culture was + Enterococcus faecalis     Plan:     Follow up prn

## 2024-01-29 ENCOUNTER — Other Ambulatory Visit: Payer: Self-pay

## 2024-01-29 ENCOUNTER — Encounter (HOSPITAL_BASED_OUTPATIENT_CLINIC_OR_DEPARTMENT_OTHER): Payer: Self-pay | Admitting: Orthopaedic Surgery

## 2024-01-29 NOTE — Progress Notes (Signed)
   01/29/24 1150  PAT Phone Screen  Is the patient taking a GLP-1 receptor agonist? (S)  Yes (last dose 01/15/24)  Has the patient been informed on holding medication? Yes  Do You Have Diabetes? No  Do You Have Hypertension? No  Have You Ever Been to the ER for Asthma? No  Have You Taken Oral Steroids in the Past 3 Months? No  Do you Take Phenteramine or any Other Diet Drugs? No  Recent  Lab Work, EKG, CXR? No  Do you have a history of heart problems? No  Any Recent Hospitalizations? No  Height 5' 6 (1.676 m)  Weight 92.5 kg  Pat Appointment Scheduled No  Reason for No Appointment Not Needed

## 2024-01-31 NOTE — Discharge Instructions (Signed)
 Netta Cedars, MD [facility]  Please read the following information regarding your care after surgery.  Medications  You only need a prescription for the narcotic pain medicine (ex. oxycodone, Percocet, Norco).  All of the other medicines listed below are available over the counter. ? Aleve 2 pills twice a day for the first 3 days after surgery. ? acetominophen (Tylenol) 650 mg every 4-6 hours as you need for minor to moderate pain ? oxycodone as prescribed for severe pain  ? To help prevent blood clots, take aspirin (81 mg) twice daily for 28 days after surgery.  You should also get up every hour while you are awake to move around.  Weight Bearing ? OK to walk on the operative leg only AFTER the nerve block has completely worn off.  Cast / Splint / Dressing ? Keep your dressing clean and dry.  Don't put anything (coat hanger, pencil, etc) down inside of it.  If it gets wet, please notify the office immediately.  Swelling IMPORTANT: It is normal for you to have swelling where you had surgery. To reduce swelling and pain, keep at least 3 pillows under your leg so that your toes are above your nose and your heel is above the level of your hip.  It may be necessary to keep your foot or leg elevated for several weeks.  This is critical to helping your incisions heal and your pain to feel better.  Follow Up Call my office at [phone] when you are discharged from the hospital or surgery center to schedule an appointment to be seen within 7-10 days after surgery.  Call my office at [phone] if you develop a fever >101.5 F, nausea, vomiting, bleeding from the surgical site or severe pain.   Post Anesthesia Home Care Instructions  Activity: Get plenty of rest for the remainder of the day. A responsible individual must stay with you for 24 hours following the procedure.  For the next 24 hours, DO NOT: -Drive a car -Advertising copywriter -Drink alcoholic beverages -Take any  medication unless instructed by your physician -Make any legal decisions or sign important papers.  Meals: Start with liquid foods such as gelatin or soup. Progress to regular foods as tolerated. Avoid greasy, spicy, heavy foods. If nausea and/or vomiting occur, drink only clear liquids until the nausea and/or vomiting subsides. Call your physician if vomiting continues.  Special Instructions/Symptoms: Your throat may feel dry or sore from the anesthesia or the breathing tube placed in your throat during surgery. If this causes discomfort, gargle with warm salt water. The discomfort should disappear within 24 hours.  If you had a scopolamine patch placed behind your ear for the management of post- operative nausea and/or vomiting:  1. The medication in the patch is effective for 72 hours, after which it should be removed.  Wrap patch in a tissue and discard in the trash. Wash hands thoroughly with soap and water. 2. You may remove the patch earlier than 72 hours if you experience unpleasant side effects which may include dry mouth, dizziness or visual disturbances. 3. Avoid touching the patch. Wash your hands with soap and water after contact with the patch.    Post Anesthesia Home Care Instructions  Activity: Get plenty of rest for the remainder of the day. A responsible individual must stay with you for 24 hours following the procedure.  For the next 24 hours, DO NOT: -Drive a car -Advertising copywriter -Drink alcoholic beverages -Take any medication unless instructed by your

## 2024-01-31 NOTE — H&P (Signed)
 ORTHOPAEDIC SURGERY H&P  Subjective:  The patient presents with right ankle mass.   Past Medical History:  Diagnosis Date   HSP (Henoch-Schonlein purpura) nephritis (HCC)    PONV (postoperative nausea and vomiting)    Sjogren syndrome (HCC)     Past Surgical History:  Procedure Laterality Date   ANKLE ARTHROSCOPY Right 05/13/2023   Procedure: ANKLE ARTHROSCOPY  debridement;  Surgeon: Ali Ink, MD;  Location: [facility];  Service: Orthopedics;  Laterality: Right;  90 MIN   LAMINOTOMY     LAPAROSCOPIC BILATERAL SALPINGECTOMY Bilateral 07/07/2018   Procedure: LAPAROSCOPIC BILATERAL SALPINGECTOMY;  Surgeon: Wendelyn Halter, MD;  Location: [facility];  Service: Gynecology;  Laterality: Bilateral;   LUMBAR DISC SURGERY     MOUTH SURGERY     NO PAST SURGERIES     REPAIR OF PERONEUS BREVIS TENDON Right 05/13/2023   Procedure: right lateral ankle stabilization and  REPAIR OF PERONEAL TENDON DEBRIDEMENT;  Surgeon: Ali Ink, MD;  Location: [facility];  Service: Orthopedics;  Laterality: Right;     (Not in an outpatient encounter)    Allergies  Allergen Reactions   Cortisone Nausea And Vomiting   Kiwi Extract    Mango Flavor [Flavoring Agent]    Pineapple Hives and Itching    Also mango and kiwi    Social History   Socioeconomic History   Marital status: Married    Spouse name: Not on file   Number of children: Not on file   Years of education: Not on file   Highest education level: Not on file  Occupational History   Not on file  Tobacco Use   Smoking status: Former    Types: Cigarettes   Smokeless tobacco: Never   Tobacco comments:    social smoker in college.  Vaping Use   Vaping status: Never Used  Substance and Sexual Activity   Alcohol use: Yes    Comment: once a week   Drug use: No   Sexual activity: Yes    Birth control/protection: Surgical    Comment: tubal  Other Topics Concern   Not on file  Social History  Narrative   Not on file   Social Drivers of Health   Financial Resource Strain: Medium Risk (04/13/2023)   Overall Financial Resource Strain (CARDIA)    Difficulty of Paying Living Expenses: Somewhat hard  Food Insecurity: No Food Insecurity (04/13/2023)   Hunger Vital Sign    Worried About Running Out of Food in the Last Year: Never true    Ran Out of Food in the Last Year: Never true  Transportation Needs: No Transportation Needs (04/13/2023)   PRAPARE - Administrator, Civil Service (Medical): No    Lack of Transportation (Non-Medical): No  Physical Activity: Insufficiently Active (04/13/2023)   Exercise Vital Sign    Days of Exercise per Week: 2 days    Minutes of Exercise per Session: 30 min  Stress: Stress Concern Present (04/13/2023)   [employer] of Occupational Health - Occupational Stress Questionnaire    Feeling of Stress : To some extent  Social Connections: Socially Integrated (04/13/2023)   Social Connection and Isolation Panel    Frequency of Communication with Friends and Family: More than three times a week    Frequency of Social Gatherings with Friends and Family: More than three times a week    Attends Religious Services: More than 4 times per year    Active Member of [employer] or Organizations:  Yes    Attends Club or Organization Meetings: More than 4 times per year    Marital Status: Married  Catering manager Violence: Not At Risk (04/13/2023)   Humiliation, Afraid, Rape, and Kick questionnaire    Fear of Current or Ex-Partner: No    Emotionally Abused: No    Physically Abused: No    Sexually Abused: No     History reviewed. No pertinent family history.   Review of Systems Pertinent items are noted in HPI.  Objective: Vital signs in last 24 hours:    01/29/2024   11:50 AM 01/28/2024    4:00 PM 01/21/2024    4:08 PM  Vitals with BMI  Height 5' 6 5' 6 5' 6  Weight 203 lbs 15 oz 204 lbs 204 lbs  BMI 32.93 32.94 32.94  Systolic  89 110   Diastolic  67 80  Pulse  93 91      EXAM: General: Well nourished, well developed. Awake, alert and oriented to time, place, person. Normal mood and affect. No apparent distress. Breathing room air.  Operative Lower Extremity: Alignment - Neutral Deformity - None Skin intact Tenderness to palpation - right ankle lateral mass 5/5 TA, PT, GS, Per, EHL, FHL Sensation intact to light touch throughout Palpable DP and PT pulses Special testing: None  The contralateral foot/ankle was examined for comparison and noted to be neurovascularly intact with no localized deformity, swelling, or tenderness.  Imaging Review All images taken were independently reviewed by me.  Assessment/Plan: The clinical and radiographic findings were reviewed and discussed at length with the patient.  The patient has right lateral ankle mass.  We spoke at length about the natural course of these findings. We discussed nonoperative and operative treatment options in detail.  The risks and benefits were presented and reviewed. The risks due to recurrent instability, hardware failure/irritation, new/persistent/recurrent infection, stiffness, nerve/vessel/tendon injury, nonunion/malunion of any fracture, wound healing issues, allograft usage, development of arthritis, failure of this surgery, possibility of external fixation in certain situations, possibility of delayed definitive surgery, need for further surgery, prolonged wound care including further soft tissue coverage procedures, thromboembolic events, anesthesia/medical complications/events perioperatively and beyond, amputation, death among others were discussed. The patient acknowledged the explanation and agreed to proceed with the plan.  Ali Ink  Orthopaedic Surgery [facility]

## 2024-02-03 ENCOUNTER — Other Ambulatory Visit: Payer: Self-pay

## 2024-02-03 ENCOUNTER — Ambulatory Visit (HOSPITAL_BASED_OUTPATIENT_CLINIC_OR_DEPARTMENT_OTHER): Admitting: Certified Registered"

## 2024-02-03 ENCOUNTER — Encounter (HOSPITAL_BASED_OUTPATIENT_CLINIC_OR_DEPARTMENT_OTHER): Payer: Self-pay | Admitting: Orthopaedic Surgery

## 2024-02-03 ENCOUNTER — Ambulatory Visit (HOSPITAL_BASED_OUTPATIENT_CLINIC_OR_DEPARTMENT_OTHER)
Admission: RE | Admit: 2024-02-03 | Discharge: 2024-02-03 | Disposition: A | Discharge status: Home / Self Care | Attending: Orthopaedic Surgery | Admitting: Orthopaedic Surgery

## 2024-02-03 ENCOUNTER — Encounter (HOSPITAL_BASED_OUTPATIENT_CLINIC_OR_DEPARTMENT_OTHER)
Admission: RE | Disposition: A | Discharge status: Home / Self Care | Payer: Self-pay | Source: Home / Self Care | Attending: Orthopaedic Surgery

## 2024-02-03 DIAGNOSIS — R2241 Localized swelling, mass and lump, right lower limb: Secondary | ICD-10-CM | POA: Diagnosis not present

## 2024-02-03 DIAGNOSIS — L905 Scar conditions and fibrosis of skin: Secondary | ICD-10-CM | POA: Insufficient documentation

## 2024-02-03 DIAGNOSIS — Z6833 Body mass index (BMI) 33.0-33.9, adult: Secondary | ICD-10-CM | POA: Insufficient documentation

## 2024-02-03 DIAGNOSIS — Z87891 Personal history of nicotine dependence: Secondary | ICD-10-CM | POA: Insufficient documentation

## 2024-02-03 DIAGNOSIS — L923 Foreign body granuloma of the skin and subcutaneous tissue: Secondary | ICD-10-CM | POA: Diagnosis not present

## 2024-02-03 DIAGNOSIS — E669 Obesity, unspecified: Secondary | ICD-10-CM | POA: Diagnosis not present

## 2024-02-03 DIAGNOSIS — M60271 Foreign body granuloma of soft tissue, not elsewhere classified, right ankle and foot: Secondary | ICD-10-CM | POA: Diagnosis not present

## 2024-02-03 DIAGNOSIS — Z01818 Encounter for other preprocedural examination: Secondary | ICD-10-CM

## 2024-02-03 DIAGNOSIS — Z1889 Other specified retained foreign body fragments: Secondary | ICD-10-CM | POA: Insufficient documentation

## 2024-02-03 LAB — POCT PREGNANCY, URINE: Preg Test, Ur: NEGATIVE

## 2024-02-03 SURGERY — EXCISION MASS LOWER EXTREMITIES
Anesthesia: General | Site: Ankle | Laterality: Right

## 2024-02-03 MED ORDER — CEFAZOLIN SODIUM-DEXTROSE 2-4 GM/100ML-% IV SOLN
INTRAVENOUS | Status: AC
  Filled 2024-02-03: qty 100

## 2024-02-03 MED ORDER — 0.9 % SODIUM CHLORIDE (POUR BTL) OPTIME
TOPICAL | Status: DC | PRN
  Administered 2024-02-03: 200 mL

## 2024-02-03 MED ORDER — FENTANYL CITRATE (PF) 100 MCG/2ML IJ SOLN
INTRAMUSCULAR | Status: DC | PRN
  Administered 2024-02-03: 25 ug via INTRAVENOUS
  Administered 2024-02-03: 50 ug via INTRAVENOUS
  Administered 2024-02-03: 25 ug via INTRAVENOUS

## 2024-02-03 MED ORDER — MIDAZOLAM HCL 5 MG/5ML IJ SOLN
INTRAMUSCULAR | Status: DC | PRN
  Administered 2024-02-03: 2 mg via INTRAVENOUS

## 2024-02-03 MED ORDER — DEXAMETHASONE SODIUM PHOSPHATE 10 MG/ML IJ SOLN
INTRAMUSCULAR | Status: AC
  Filled 2024-02-03: qty 1

## 2024-02-03 MED ORDER — LIDOCAINE 2% (20 MG/ML) 5 ML SYRINGE
INTRAMUSCULAR | Status: AC
Start: 2024-02-03 — End: 2024-02-03
  Filled 2024-02-03: qty 5

## 2024-02-03 MED ORDER — VANCOMYCIN HCL 500 MG IV SOLR
INTRAVENOUS | Status: AC
  Filled 2024-02-03: qty 60

## 2024-02-03 MED ORDER — BUPIVACAINE HCL (PF) 0.25 % IJ SOLN
INTRAMUSCULAR | Status: AC
  Filled 2024-02-03: qty 120

## 2024-02-03 MED ORDER — LACTATED RINGERS IV SOLN: INTRAVENOUS | Status: DC

## 2024-02-03 MED ORDER — CEFAZOLIN SODIUM-DEXTROSE 2-4 GM/100ML-% IV SOLN
2.0000 g | INTRAVENOUS | Status: AC
Start: 2024-02-03 — End: 2024-02-03
  Administered 2024-02-03: 2 g via INTRAVENOUS

## 2024-02-03 MED ORDER — DEXAMETHASONE SODIUM PHOSPHATE 10 MG/ML IJ SOLN
INTRAMUSCULAR | Status: DC | PRN
  Administered 2024-02-03: 5 mg via INTRAVENOUS

## 2024-02-03 MED ORDER — ONDANSETRON HCL 4 MG/2ML IJ SOLN
INTRAMUSCULAR | Status: DC | PRN
  Administered 2024-02-03: 4 mg via INTRAVENOUS

## 2024-02-03 MED ORDER — BUPIVACAINE-EPINEPHRINE 0.5% -1:200000 IJ SOLN
INTRAMUSCULAR | Status: DC | PRN
Start: 2024-02-03 — End: 2024-02-03
  Administered 2024-02-03: 10 mL

## 2024-02-03 MED ORDER — ONDANSETRON HCL 4 MG/2ML IJ SOLN
INTRAMUSCULAR | Status: AC
Start: 2024-02-03 — End: 2024-02-03
  Filled 2024-02-03: qty 2

## 2024-02-03 MED ORDER — KETOROLAC TROMETHAMINE 30 MG/ML IJ SOLN
30.0000 mg | Freq: Once | INTRAMUSCULAR | Status: AC | PRN
  Administered 2024-02-03: 30 mg via INTRAVENOUS

## 2024-02-03 MED ORDER — FENTANYL CITRATE (PF) 100 MCG/2ML IJ SOLN
INTRAMUSCULAR | Status: AC
  Filled 2024-02-03: qty 2

## 2024-02-03 MED ORDER — PROPOFOL 10 MG/ML IV BOLUS
INTRAVENOUS | Status: DC | PRN
  Administered 2024-02-03: 200 mg via INTRAVENOUS

## 2024-02-03 MED ORDER — PROPOFOL 10 MG/ML IV BOLUS
INTRAVENOUS | Status: AC
Start: 2024-02-03 — End: 2024-02-03
  Filled 2024-02-03: qty 20

## 2024-02-03 MED ORDER — KETOROLAC TROMETHAMINE 30 MG/ML IJ SOLN
INTRAMUSCULAR | Status: AC
  Filled 2024-02-03: qty 1

## 2024-02-03 MED ORDER — CHLORHEXIDINE GLUCONATE 4 % EX SOLN: 60.0000 mL | Freq: Once | CUTANEOUS | Status: DC

## 2024-02-03 MED ORDER — BUPIVACAINE-EPINEPHRINE (PF) 0.5% -1:200000 IJ SOLN
INTRAMUSCULAR | Status: AC
Start: 2024-02-03 — End: 2024-02-03
  Filled 2024-02-03: qty 90

## 2024-02-03 MED ORDER — MIDAZOLAM HCL 2 MG/2ML IJ SOLN
INTRAMUSCULAR | Status: AC
  Filled 2024-02-03: qty 2

## 2024-02-03 MED ORDER — POVIDONE-IODINE 10 % EX SOLN
CUTANEOUS | Status: DC | PRN
  Administered 2024-02-03: 1 via TOPICAL

## 2024-02-03 MED ORDER — PROPOFOL 10 MG/ML IV BOLUS
INTRAVENOUS | Status: AC
  Filled 2024-02-03: qty 20

## 2024-02-03 MED ORDER — LIDOCAINE HCL (CARDIAC) PF 100 MG/5ML IV SOSY
PREFILLED_SYRINGE | INTRAVENOUS | Status: DC | PRN
  Administered 2024-02-03: 60 mg via INTRAVENOUS

## 2024-02-03 SURGICAL SUPPLY — 40 items
BANDAGE ESMARK 6X9 LF (GAUZE/BANDAGES/DRESSINGS) IMPLANT
BLADE SURG 15 STRL LF DISP TIS (BLADE) ×2 IMPLANT
BNDG COHESIVE 4X5 TAN STRL LF (GAUZE/BANDAGES/DRESSINGS) IMPLANT
BNDG ELASTIC 4INX 5YD STR LF (GAUZE/BANDAGES/DRESSINGS) ×1 IMPLANT
BNDG GAUZE DERMACEA FLUFF 4 (GAUZE/BANDAGES/DRESSINGS) ×1 IMPLANT
CANISTER SUCT 1200ML W/VALVE (MISCELLANEOUS) IMPLANT
CHLORAPREP W/TINT 26 (MISCELLANEOUS) ×1 IMPLANT
COVER BACK TABLE 60X90IN (DRAPES) ×1 IMPLANT
CUFF TRNQT CYL 34X4.125X (TOURNIQUET CUFF) ×1 IMPLANT
DRAPE EXTREMITY T 121X128X90 (DISPOSABLE) ×1 IMPLANT
DRAPE IMP U-DRAPE 54X76 (DRAPES) ×1 IMPLANT
DRAPE U-SHAPE 47X51 STRL (DRAPES) ×1 IMPLANT
DRSG MEPITEL 4X7.2 (GAUZE/BANDAGES/DRESSINGS) ×1 IMPLANT
ELECTRODE REM PT RTRN 9FT ADLT (ELECTROSURGICAL) ×1 IMPLANT
GAUZE PAD ABD 8X10 STRL (GAUZE/BANDAGES/DRESSINGS) IMPLANT
GAUZE SPONGE 4X4 12PLY STRL (GAUZE/BANDAGES/DRESSINGS) ×1 IMPLANT
GLOVE BIOGEL PI IND STRL 8 (GLOVE) ×1 IMPLANT
GLOVE SURG SS PI 7.5 STRL IVOR (GLOVE) ×1 IMPLANT
GOWN STRL REUS W/ TWL LRG LVL3 (GOWN DISPOSABLE) ×2 IMPLANT
NDL HYPO 22X1.5 SAFETY MO (MISCELLANEOUS) IMPLANT
NEEDLE HYPO 22X1.5 SAFETY MO (MISCELLANEOUS) IMPLANT
NS IRRIG 1000ML POUR BTL (IV SOLUTION) ×1 IMPLANT
PACK BASIN DAY SURGERY FS (CUSTOM PROCEDURE TRAY) ×1 IMPLANT
PAD CAST 4YDX4 CTTN HI CHSV (CAST SUPPLIES) IMPLANT
PADDING CAST ABS COTTON 4X4 ST (CAST SUPPLIES) IMPLANT
PENCIL SMOKE EVACUATOR (MISCELLANEOUS) IMPLANT
SHEET MEDIUM DRAPE 40X70 STRL (DRAPES) ×1 IMPLANT
SLEEVE SCD COMPRESS KNEE MED (STOCKING) ×1 IMPLANT
SPIKE FLUID TRANSFER (MISCELLANEOUS) IMPLANT
SPONGE T-LAP 18X18 ~~LOC~~+RFID (SPONGE) ×1 IMPLANT
STOCKINETTE 6 STRL (DRAPES) IMPLANT
SUCTION TUBE FRAZIER 10FR DISP (SUCTIONS) IMPLANT
SUT ETHILON 2 0 FS 18 (SUTURE) ×1 IMPLANT
SUT VIC AB 0 CT1 27XBRD ANBCTR (SUTURE) IMPLANT
SUT VIC AB 2-0 CT1 TAPERPNT 27 (SUTURE) IMPLANT
SYR 10ML LL (SYRINGE) IMPLANT
SYR BULB EAR ULCER 3OZ GRN STR (SYRINGE) ×1 IMPLANT
TOWEL GREEN STERILE FF (TOWEL DISPOSABLE) ×2 IMPLANT
TUBE CONNECTING 20X1/4 (TUBING) IMPLANT
UNDERPAD 30X36 HEAVY ABSORB (UNDERPADS AND DIAPERS) ×1 IMPLANT

## 2024-02-03 NOTE — Op Note (Signed)
 02/03/2024  5:10 PM   PATIENT: Natalie Foster  38 y.o. female  MRN: [id number]   PRE-OPERATIVE DIAGNOSIS:   Mass of right ankle   POST-OPERATIVE DIAGNOSIS:   Same   PROCEDURE: Right ankle mass excisional biopsy   SURGEON:  Ali Ink, MD   ASSISTANT: None   ANESTHESIA: General, regional   EBL: Minimal   TOURNIQUET:   None   COMPLICATIONS: None apparent   DISPOSITION: Extubated, awake and stable to recovery.   INDICATION FOR PROCEDURE: The patient presented with above diagnosis.  We discussed the diagnosis, alternative treatment options, risks and benefits of the above surgical intervention, as well as alternative non-operative treatments. All questions/concerns were addressed and the patient/family demonstrated appropriate understanding of the diagnosis, the procedure, the postoperative course, and overall prognosis. The patient wished to proceed with surgical intervention and signed an informed surgical consent as such, in each others presence prior to surgery.   PROCEDURE IN DETAIL: After preoperative consent was obtained and the correct operative site was identified, the patient was brought to the operating room supine on stretcher and transferred onto operating table. General anesthesia was induced. Preoperative antibiotics were administered. Surgical timeout was taken. The patient was then positioned supine with an ipsilateral hip bump. The operative lower extremity was prepped and draped in standard sterile fashion with a tourniquet around the thigh. The extremity was exsanguinated and the tourniquet was inflated to 275 mmHg.  The ankle was examined and noted to be completely stable to intraoperative testing.  We utilized the prior lateral ankle scar to approach the mass. Dissection was carried around and the mass was excised completely along with its stalk. Underneath the mass there was noted to be the knot of non absorbable suture from prior  Brostrom lateral ankle ligament reconstruction. This non absorbable suture was excised as well. The mass was sent for pathology analysis.  The surgical sites were thoroughly irrigated. The tourniquet was deflated and hemostasis achieved. Betadine and vancomycin  powder were applied. The deep layers were closed using 2-0 vicryl. The skin was closed without tension.    The leg was cleaned with saline and sterile dressings with gauze were applied. A well padded wrap was applied. The patient was awakened from anesthesia and transported to the recovery room in stable condition.    FOLLOW UP PLAN: -transfer to PACU, then home -WBAT operative extremity, maximum elevation -maintain dry dressings until follow up -DVT ppx: Aspirin 81 mg twice daily -follow up as outpatient within 7-10 days for wound check -sutures out in 2-3 weeks in outpatient office   RADIOGRAPHS: None today   Ali Ink Orthopaedic Surgery [facility]

## 2024-02-03 NOTE — Anesthesia Postprocedure Evaluation (Signed)
 Anesthesia Post Note  Patient: Natalie Foster  Procedure(s) Performed: EXCISION MASS LOWER EXTREMITIES (Right: Ankle)     Patient location during evaluation: PACU Anesthesia Type: General Level of consciousness: awake Pain management: pain level controlled Vital Signs Assessment: post-procedure vital signs reviewed and stable Respiratory status: spontaneous breathing, nonlabored ventilation and respiratory function stable Cardiovascular status: blood pressure returned to baseline and stable Postop Assessment: no apparent nausea or vomiting Anesthetic complications: no   No notable events documented.  Last Vitals:  Vitals:   02/03/24 0930 02/03/24 0952  BP: 110/75 117/79  Pulse: 77 89  Resp: 13 16  Temp:  36.4 C  SpO2: 99% 98%    Last Pain:  Vitals:   02/03/24 0952  TempSrc:   PainSc: 3                  Izek Corvino P Myra Weng

## 2024-02-03 NOTE — Anesthesia Procedure Notes (Signed)
 Procedure Name: LMA Insertion Date/Time: 02/03/2024 8:44 AM  Performed by: Noralyn Beams, CRNAPre-anesthesia Checklist: Patient identified, Emergency Drugs available, Suction available and Patient being monitored Patient Re-evaluated:Patient Re-evaluated prior to induction Oxygen Delivery Method: Circle system utilized Preoxygenation: Pre-oxygenation with 100% oxygen Induction Type: IV induction Ventilation: Mask ventilation without difficulty LMA: LMA inserted LMA Size: 4.0 Number of attempts: 1 Airway Equipment and Method: Bite block Placement Confirmation: positive ETCO2 Tube secured with: Tape Dental Injury: Teeth and Oropharynx as per pre-operative assessment

## 2024-02-03 NOTE — H&P (Signed)
 H&P Update:  -History and Physical Reviewed  -Patient has been re-examined  -No change in the plan of care  -The risks and benefits were presented and reviewed. The risks due to recurrence, hardware/suture failure and/or irritation, new/persistent infection, stiffness, nerve/vessel/tendon injury or rerupture of repaired tendon, nonunion/malunion, allograft usage, wound healing issues, development of arthritis, failure of this surgery, possibility of external fixation with delayed definitive surgery, need for further surgery, thromboembolic events, anesthesia/medical complications, amputation, death among others were discussed. The patient acknowledged the explanation, agreed to proceed with the plan and a consent was signed.  Natalie Foster

## 2024-02-03 NOTE — Transfer of Care (Signed)
 Immediate Anesthesia Transfer of Care Note  Patient: Natalie Foster  Procedure(s) Performed: EXCISION MASS LOWER EXTREMITIES (Right: Ankle)  Patient Location: PACU  Anesthesia Type:General  Level of Consciousness: awake, alert , and oriented  Airway & Oxygen Therapy: Patient Spontanous Breathing and Patient connected to face mask oxygen  Post-op Assessment: Report given to RN and Post -op Vital signs reviewed and stable  Post vital signs: Reviewed and stable  Last Vitals:  Vitals Value Taken Time  BP 108/88 02/03/24 09:11  Temp 36.5 C 02/03/24 09:11  Pulse 82 02/03/24 09:14  Resp 12 02/03/24 09:14  SpO2 100 % 02/03/24 09:14  Vitals shown include unfiled device data.  Last Pain:  Vitals:   02/03/24 0911  TempSrc:   PainSc: 5          Complications: No notable events documented.

## 2024-02-03 NOTE — Anesthesia Preprocedure Evaluation (Addendum)
 Anesthesia Evaluation  Patient identified by MRN, date of birth, ID band Patient awake    Reviewed: Allergy & Precautions, NPO status , Patient's Chart, lab work & pertinent test results  History of Anesthesia Complications (+) PONV and history of anesthetic complications  Airway Mallampati: II  TM Distance: >3 FB Neck ROM: Full    Dental no notable dental hx.    Pulmonary former smoker   Pulmonary exam normal        Cardiovascular negative cardio ROS Normal cardiovascular exam     Neuro/Psych  PSYCHIATRIC DISORDERS      negative neurological ROS     GI/Hepatic negative GI ROS, Neg liver ROS,,,  Endo/Other  Patient on GLP-1 Agonist  Renal/GU Renal disease     Musculoskeletal negative musculoskeletal ROS (+)    Abdominal  (+) + obese  Peds  Hematology negative hematology ROS (+)   Anesthesia Other Findings Mass of right ankle  Reproductive/Obstetrics Hcg negative                              Anesthesia Physical Anesthesia Plan  ASA: 2  Anesthesia Plan: General   Post-op Pain Management:    Induction: Intravenous  PONV Risk Score and Plan: 4 or greater and Ondansetron , Dexamethasone , Propofol  infusion, Midazolam  and Treatment may vary due to age or medical condition  Airway Management Planned: LMA  Additional Equipment:   Intra-op Plan:   Post-operative Plan: Extubation in OR  Informed Consent: I have reviewed the patients History and Physical, chart, labs and discussed the procedure including the risks, benefits and alternatives for the proposed anesthesia with the patient or authorized representative who has indicated his/her understanding and acceptance.     Dental advisory given  Plan Discussed with: CRNA  Anesthesia Plan Comments:         Anesthesia Quick Evaluation

## 2024-02-04 ENCOUNTER — Encounter (HOSPITAL_BASED_OUTPATIENT_CLINIC_OR_DEPARTMENT_OTHER): Payer: Self-pay | Admitting: Orthopaedic Surgery

## 2024-02-04 LAB — SURGICAL PATHOLOGY

## 2024-02-10 ENCOUNTER — Encounter: Payer: Self-pay | Admitting: Physician Assistant

## 2024-02-10 ENCOUNTER — Ambulatory Visit (INDEPENDENT_AMBULATORY_CARE_PROVIDER_SITE_OTHER): Admitting: Physician Assistant

## 2024-02-10 VITALS — BP 110/72 | HR 80

## 2024-02-10 DIAGNOSIS — D492 Neoplasm of unspecified behavior of bone, soft tissue, and skin: Secondary | ICD-10-CM | POA: Diagnosis not present

## 2024-02-10 DIAGNOSIS — L814 Other melanin hyperpigmentation: Secondary | ICD-10-CM

## 2024-02-10 DIAGNOSIS — D225 Melanocytic nevi of trunk: Secondary | ICD-10-CM | POA: Diagnosis not present

## 2024-02-10 DIAGNOSIS — W908XXA Exposure to other nonionizing radiation, initial encounter: Secondary | ICD-10-CM | POA: Diagnosis not present

## 2024-02-10 DIAGNOSIS — Z808 Family history of malignant neoplasm of other organs or systems: Secondary | ICD-10-CM | POA: Diagnosis not present

## 2024-02-10 DIAGNOSIS — D229 Melanocytic nevi, unspecified: Secondary | ICD-10-CM

## 2024-02-10 DIAGNOSIS — L821 Other seborrheic keratosis: Secondary | ICD-10-CM

## 2024-02-10 DIAGNOSIS — L578 Other skin changes due to chronic exposure to nonionizing radiation: Secondary | ICD-10-CM

## 2024-02-10 DIAGNOSIS — Z1283 Encounter for screening for malignant neoplasm of skin: Secondary | ICD-10-CM

## 2024-02-10 DIAGNOSIS — D1801 Hemangioma of skin and subcutaneous tissue: Secondary | ICD-10-CM

## 2024-02-10 NOTE — Progress Notes (Signed)
   New Patient Visit   Subjective  Natalie Foster is a 38 y.o. female who presents for the following: Skin Cancer Screening and Full Body Skin Exam  The patient presents for Total-Body Skin Exam (TBSE) for skin cancer screening and mole check. The patient has spots, moles and lesions to be evaluated, some may be new or changing and the patient may have concern these could be cancer.  Pt has no hx of skin cancer. Pt has family hx of MM and NMSC  The following portions of the chart were reviewed this encounter and updated as appropriate: medications, allergies, medical history  Review of Systems:  No other skin or systemic complaints except as noted in HPI or Assessment and Plan.  Objective  Well appearing patient in no apparent distress; mood and affect are within normal limits.  A full examination was performed including scalp, head, eyes, ears, nose, lips, neck, chest, axillae, abdomen, back, buttocks, bilateral upper extremities, bilateral lower extremities, hands, feet, fingers, toes, fingernails, and toenails. All findings within normal limits unless otherwise noted below.   Relevant physical exam findings are noted in the Assessment and Plan.  Mid Back 1.4 cm irregular brown macule   Assessment & Plan   SKIN CANCER SCREENING PERFORMED TODAY.  ACTINIC DAMAGE - Chronic condition, secondary to cumulative UV/sun exposure - diffuse scaly erythematous macules with underlying dyspigmentation - Recommend daily broad spectrum sunscreen SPF 30+ to sun-exposed areas, reapply every 2 hours as needed.  - Staying in the shade or wearing long sleeves, sun glasses (UVA+UVB protection) and wide brim hats (4-inch brim around the entire circumference of the hat) are also recommended for sun protection.  - Call for new or changing lesions.   MELANOCYTIC NEVI - Tan-brown and/or pink-flesh-colored symmetric macules and papules - Benign appearing on exam today - Observation - Call clinic for  new or changing moles - Recommend daily use of broad spectrum spf 30+ sunscreen to sun-exposed areas.   CHERRY ANGIOMA Exam: red papule(s) Discussed benign nature. Recommend observation. Call for changes.   LENTIGINES Exam: scattered tan macules Due to sun exposure Treatment Plan: Benign-appearing, observe. Recommend daily broad spectrum sunscreen SPF 30+ to sun-exposed areas, reapply every 2 hours as needed.  Call for any changes   SEBORRHEIC KERATOSIS - Stuck-on, waxy, tan-brown papules and/or plaques  - Benign-appearing - Discussed benign etiology and prognosis. - Observe - Call for any changes     NEOPLASM OF SKIN Mid Back Skin / nail biopsy Type of biopsy: tangential   Specimen 1 - Surgical pathology Differential Diagnosis: R/O irritated nevus vs other  Check Margins: No SCREENING EXAM FOR SKIN CANCER   ACTINIC SKIN DAMAGE   MULTIPLE BENIGN NEVI   CHERRY ANGIOMA   LENTIGINES   SEBORRHEIC KERATOSIS    Return in about 1 year (around 02/09/2025) for TBSE.  I, Darice Smock, CMA, am acting as scribe for [employer], PA-C.   Documentation: I have reviewed the above documentation for accuracy and completeness, and I agree with the above.  Trinnity Breunig K, PA-C

## 2024-02-10 NOTE — Patient Instructions (Addendum)

## 2024-02-11 LAB — SURGICAL PATHOLOGY

## 2024-02-13 ENCOUNTER — Ambulatory Visit: Payer: Self-pay | Admitting: Physician Assistant

## 2024-02-24 DIAGNOSIS — R109 Unspecified abdominal pain: Secondary | ICD-10-CM | POA: Diagnosis not present

## 2024-02-24 DIAGNOSIS — R112 Nausea with vomiting, unspecified: Secondary | ICD-10-CM | POA: Diagnosis not present

## 2024-02-25 DIAGNOSIS — F411 Generalized anxiety disorder: Secondary | ICD-10-CM | POA: Diagnosis not present

## 2024-02-25 DIAGNOSIS — F900 Attention-deficit hyperactivity disorder, predominantly inattentive type: Secondary | ICD-10-CM | POA: Diagnosis not present

## 2024-02-27 DIAGNOSIS — Z419 Encounter for procedure for purposes other than remedying health state, unspecified: Secondary | ICD-10-CM | POA: Diagnosis not present

## 2024-03-15 DIAGNOSIS — M549 Dorsalgia, unspecified: Secondary | ICD-10-CM | POA: Diagnosis not present

## 2024-03-15 DIAGNOSIS — Z7182 Exercise counseling: Secondary | ICD-10-CM | POA: Diagnosis not present

## 2024-03-15 DIAGNOSIS — Z713 Dietary counseling and surveillance: Secondary | ICD-10-CM | POA: Diagnosis not present

## 2024-03-15 DIAGNOSIS — R768 Other specified abnormal immunological findings in serum: Secondary | ICD-10-CM | POA: Diagnosis not present

## 2024-03-15 DIAGNOSIS — F418 Other specified anxiety disorders: Secondary | ICD-10-CM | POA: Diagnosis not present

## 2024-03-15 DIAGNOSIS — E669 Obesity, unspecified: Secondary | ICD-10-CM | POA: Diagnosis not present

## 2024-03-15 DIAGNOSIS — F909 Attention-deficit hyperactivity disorder, unspecified type: Secondary | ICD-10-CM | POA: Diagnosis not present

## 2024-03-29 DIAGNOSIS — Z419 Encounter for procedure for purposes other than remedying health state, unspecified: Secondary | ICD-10-CM | POA: Diagnosis not present

## 2024-04-06 ENCOUNTER — Ambulatory Visit (INDEPENDENT_AMBULATORY_CARE_PROVIDER_SITE_OTHER): Admitting: Allergy & Immunology

## 2024-04-06 ENCOUNTER — Other Ambulatory Visit: Payer: Self-pay

## 2024-04-06 ENCOUNTER — Encounter: Payer: Self-pay | Admitting: Allergy & Immunology

## 2024-04-06 VITALS — BP 116/80 | HR 99 | Temp 98.1°F | Resp 18 | Ht 66.0 in | Wt 204.0 lb

## 2024-04-06 DIAGNOSIS — J310 Chronic rhinitis: Secondary | ICD-10-CM | POA: Diagnosis not present

## 2024-04-06 DIAGNOSIS — T7800XA Anaphylactic reaction due to unspecified food, initial encounter: Secondary | ICD-10-CM

## 2024-04-06 DIAGNOSIS — D8940 Mast cell activation, unspecified: Secondary | ICD-10-CM

## 2024-04-06 DIAGNOSIS — T7800XD Anaphylactic reaction due to unspecified food, subsequent encounter: Secondary | ICD-10-CM

## 2024-04-06 MED ORDER — CETIRIZINE HCL 10 MG PO TABS: 10.0000 mg | ORAL_TABLET | Freq: Two times a day (BID) | ORAL | 1 refills | Status: AC

## 2024-04-06 MED ORDER — FAMOTIDINE 20 MG PO TABS: 20.0000 mg | ORAL_TABLET | Freq: Two times a day (BID) | ORAL | 1 refills | Status: AC

## 2024-04-06 NOTE — Patient Instructions (Addendum)
 1. Anaphylaxis due to food (pineapple, mango, kiwi) - We will get some labs to look for the allergy levels to the above foods. - We can defer on an EpiPen  for now, but we can consider this in the future if needed.  2. Concern for mast cell activation syndrome - We are going to get a slew of urine and blood tests to look for the the metabolites of mast cell activation. - This includes tryptase, but also a number of other chemical mediators.  - This is often a hard diagnosis to make, so we might have to do more labs at some point. - BUT, let's start a suppressive antihistamine regimen to see if this helps at all.  - Morning: Zyrtec  (10mg ) and Pepcid  (famotidine ) 20mg   - Evening: Zyrtec  (10mg ) and Pepcid  (famotidine ) 20mg  - You can change this dosing at home, decreasing the dose as needed or increasing the dosing as needed.  - If you are not tolerating the medications or are tired of taking them every day, we can start treatment with a monthly injectable medication called Xolair.  3. Chronic rhinitis - Because of insurance stipulations, we cannot do skin testing on the same day as your first visit. - We are all working to fight this, but for now we need to do two separate visits.  - We will know more after we do testing at the next visit.  - The skin testing visit can be squeezed in at your convenience.  - Then we can make a more full plan to address all of your symptoms. - Be sure to stop your antihistamines for 3 days before this appointment.   4. Return in about 2 weeks (around 04/20/2024) for SKIN TESTING (1-55). You can have the follow up appointment with Dr. Iva or a Nurse Practicioner (our Nurse Practitioners are excellent and always have Physician oversight!).    Please inform us  of any Emergency Department visits, hospitalizations, or changes in symptoms. Call us  before going to the ED for breathing or allergy symptoms since we might be able to fit you in for a sick visit. Feel  free to contact us  anytime with any questions, problems, or concerns.  It was a pleasure to see you again today!  Websites that have reliable patient information: 1. American Academy of Asthma, Allergy, and Immunology: www.aaaai.org 2. Food Allergy Research and Education (FARE): foodallergy.org 3. Mothers of Asthmatics: http://www.asthmacommunitynetwork.org 4. American College of Allergy, Asthma, and Immunology: www.acaai.org      "Like" us  on Facebook and Instagram for our latest updates!      A healthy democracy works best when [employer] participate! Make sure you are registered to vote! If you have moved or changed any of your contact information, you will need to get this updated before voting! Scan the QR codes below to learn more!

## 2024-04-06 NOTE — Progress Notes (Unsigned)
 NEW PATIENT  Date of Service/Encounter:  04/06/24  Consult requested by: Foster, Natalie C, FNP   Assessment:   Anaphylaxis due to food (pineapple, mango, kiwi)  Patient concern for mast cell activation syndrome - with flushing/itching/rashes  Mixed connective tissue disease - sees [facility] (Natalie Foster)  Recurrent infections - including shingles   Plan/Recommendations:   1. Anaphylaxis due to food (pineapple, mango, kiwi) - We will get some labs to look for the allergy levels to the above foods. - We can defer on an EpiPen  for now, but we can consider this in the future if needed.  2. Concern for mast cell activation syndrome - We are going to get a slew of urine and blood tests to look for the the metabolites of mast cell activation. - This includes tryptase, but also a number of other chemical mediators.  - This is often a hard diagnosis to make, so we might have to do more labs at some point. - BUT, let's start a suppressive antihistamine regimen to see if this helps at all.  - Morning: Zyrtec  (10mg ) and Pepcid  (famotidine ) 20mg   - Evening: Zyrtec  (10mg ) and Pepcid  (famotidine ) 20mg  - You can change this dosing at home, decreasing the dose as needed or increasing the dosing as needed.  - If you are not tolerating the medications or are tired of taking them every day, we can start treatment with a monthly injectable medication called Xolair.  3. Chronic rhinitis - Because of insurance stipulations, we cannot do skin testing on the same day as your first visit. - We are all working to fight this, but for now we need to do two separate visits.  - We will know more after we do testing at the next visit.  - The skin testing visit can be squeezed in at your convenience.  - Then we can make a more full plan to address all of your symptoms. - Be sure to stop your antihistamines for 3 days before this appointment.   4. Return in about 2 weeks (around 04/20/2024)  for SKIN TESTING (1-55). You can have the follow up appointment with Natalie Foster or a Nurse Practicioner (our Nurse Practitioners are excellent and always have Physician oversight!).    This note in its entirety was forwarded to the Provider who requested this consultation.  Subjective:   Natalie Foster is a 38 y.o. female presenting today for evaluation of  Chief Complaint  Patient presents with  . Establish Care    States that pcp and rhuemo thinks she has mast cell disease.  Food allergies: pineapple-anaphylaxis, kiwi, and mango- itchy mouth, swelling of tongue    Natalie Foster has a history of the following: Patient Active Problem List   Diagnosis Date Noted  . Boil 01/21/2024  . Class 1 obesity due to excess calories without serious comorbidity with body mass index (BMI) of 33.0 to 33.9 in adult 11/11/2021  . Mixed hyperlipidemia 11/11/2021  . Abdominal pain, RLQ (right lower quadrant) 05/11/2018    History obtained from: chart review and patient.  Discussed the use of AI scribe software for clinical note transcription with the patient and/or guardian, who gave verbal consent to proceed.  Natalie Foster was referred by Foster, Natalie C, FNP.     Natalie Foster is a 38 y.o. female presenting for an evaluation of a multitude of complaints, but most prominently mast  cell activation syndrome.    Asthma/Respiratory Symptom History: ***  Allergic Rhinitis  Symptom History: ***  Food Allergy Symptom History: ***  Skin Symptom History: ***  GERD Symptom History: ***  Infection Symptom History: ***  ***Otherwise, there is no history of other atopic diseases, including {Blank multiple:19196:o:asthma,food allergies,drug allergies,environmental allergies,stinging insect allergies,eczema,urticaria,contact dermatitis}. There is no significant infectious history. ***Vaccinations are up to date.    Past Medical History: Patient Active Problem List   Diagnosis Date  Noted  . Boil 01/21/2024  . Class 1 obesity due to excess calories without serious comorbidity with body mass index (BMI) of 33.0 to 33.9 in adult 11/11/2021  . Mixed hyperlipidemia 11/11/2021  . Abdominal pain, RLQ (right lower quadrant) 05/11/2018    Medication List:  Allergies as of 04/06/2024       Reactions   Cortisone Nausea And Vomiting   Kiwi Extract    Mango Flavor [flavoring Agent]    Pineapple Hives, Itching   Also mango and kiwi        Medication List        Accurate as of April 06, 2024  4:12 PM. If you have any questions, ask your nurse or doctor.          cetirizine  10 MG tablet Commonly known as: ZYRTEC  Take 1 tablet (10 mg total) by mouth 2 (two) times daily. Started by: Natalie Foster   escitalopram 20 MG tablet Commonly known as: LEXAPRO Take 20 mg by mouth daily.   famotidine  20 MG tablet Commonly known as: Pepcid  Take 1 tablet (20 mg total) by mouth 2 (two) times daily. Started by: Natalie Foster   hydrOXYzine 25 MG tablet Commonly known as: ATARAX Take 1 tablet by mouth daily as needed.   lisdexamfetamine 20 MG capsule Commonly known as: VYVANSE Take 20 mg by mouth daily.   MAGNESIUM-OXIDE PO Take 1 tablet by mouth daily in the afternoon.   multivitamin with minerals Tabs tablet Take 1 tablet by mouth daily.   ondansetron  4 MG tablet Commonly known as: ZOFRAN  Take 1 tablet twice a day by oral route as needed for 7 days.   penicillin  v potassium 500 MG tablet Commonly known as: VEETID Take 1 tablet (500 mg total) by mouth 4 (four) times daily.   rizatriptan 10 MG tablet Commonly known as: MAXALT Take by mouth.   Wegovy 1 MG/0.5ML Soaj SQ injection Generic drug: semaglutide-weight management INJECT 0.5 MLS SUBCUTANEOUSLY ONCE WEEKLY   ZINC ACETATE PO Take 10 mg by mouth daily as needed.        Birth History: {Blank single:19197::non-contributory,born premature and spent time in the NICU,born at term  without complications}  Developmental History: Shabrea has met all milestones on time. She has required no {Blank multiple:19196:a:speech therapy,occupational therapy,physical therapy}. ***non-contributory  Past Surgical History: Past Surgical History:  Procedure Laterality Date  . ANKLE ARTHROSCOPY Right 05/13/2023   Procedure: ANKLE ARTHROSCOPY  debridement;  Surgeon: Barton Drape, MD;  Location: [facility];  Service: Orthopedics;  Laterality: Right;  90 MIN  . EXCISION MASS LOWER EXTREMETIES Right 02/03/2024   Procedure: EXCISION MASS LOWER EXTREMITIES;  Surgeon: Barton Drape, MD;  Location: [facility];  Service: Orthopedics;  Laterality: Right;  . LAMINOTOMY    . LAPAROSCOPIC BILATERAL SALPINGECTOMY Bilateral 07/07/2018   Procedure: LAPAROSCOPIC BILATERAL SALPINGECTOMY;  Surgeon: Jayne Vonn DEL, MD;  Location: [facility];  Service: Gynecology;  Laterality: Bilateral;  . LUMBAR DISC SURGERY    . MOUTH SURGERY    . NO PAST SURGERIES    . REPAIR OF PERONEUS  BREVIS TENDON Right 05/13/2023   Procedure: right lateral ankle stabilization and  REPAIR OF PERONEAL TENDON DEBRIDEMENT;  Surgeon: Barton Drape, MD;  Location: [facility];  Service: Orthopedics;  Laterality: Right;     Family History: Family History  Problem Relation Age of Onset  . Cancer Mother   . Hypertension Mother   . Thyroid  disease Mother   . Cancer Father   . Hyperlipidemia Father   . Breast cancer Paternal Aunt   . Breast cancer Paternal Aunt   . Breast cancer Paternal Aunt   . Cancer Paternal Grandmother        lung  . Stroke Paternal Grandfather   . Cancer Paternal Grandfather        lung  . Asthma Son   . Allergic rhinitis Son   . Allergic rhinitis Son      Social History: Gerardo lives at home with her family.  They live in a house that is around the 38 years old.  There is LVP flooring throughout the home.  They have electric heating and  central cooling.  There are dust mite covers on the bedding.  There are no roaches in the home.  There is no tobacco exposure.  There is a dog and a cat inside of the home.  There are no fumes, chemicals, or dust exposures.  She has a couple of children who are both homeschooled.   Review of systems otherwise negative other than that mentioned in the HPI.    Objective:   Blood pressure 116/80, pulse 99, temperature 98.1 F (36.7 C), temperature source Temporal, resp. rate 18, height 5' 6 (1.676 m), weight 204 lb (92.5 kg), SpO2 96%. Body mass index is 32.93 kg/m.     Physical Exam   Diagnostic studies: labs sent instead           Natalie Shaggy, MD [facility] 

## 2024-04-07 DIAGNOSIS — R21 Rash and other nonspecific skin eruption: Secondary | ICD-10-CM | POA: Diagnosis not present

## 2024-04-07 DIAGNOSIS — R5383 Other fatigue: Secondary | ICD-10-CM | POA: Diagnosis not present

## 2024-04-07 DIAGNOSIS — M3500 Sicca syndrome, unspecified: Secondary | ICD-10-CM | POA: Diagnosis not present

## 2024-04-07 DIAGNOSIS — M359 Systemic involvement of connective tissue, unspecified: Secondary | ICD-10-CM | POA: Diagnosis not present

## 2024-04-07 DIAGNOSIS — M2550 Pain in unspecified joint: Secondary | ICD-10-CM | POA: Diagnosis not present

## 2024-04-11 ENCOUNTER — Other Ambulatory Visit: Payer: Self-pay | Admitting: Allergy & Immunology

## 2024-04-11 DIAGNOSIS — D8940 Mast cell activation, unspecified: Secondary | ICD-10-CM | POA: Diagnosis not present

## 2024-04-12 LAB — UPEP/TP, 24-HR URINE

## 2024-04-15 ENCOUNTER — Ambulatory Visit: Payer: Self-pay | Admitting: Allergy & Immunology

## 2024-04-19 LAB — KIT (D816V) DIGITAL PCR

## 2024-04-20 LAB — PROTEIN ELECTROPHORESIS, SERUM, WITH REFLEX
A/G Ratio: 1.2 (ref 0.7–1.7)
Albumin ELP: 3.7 g/dL (ref 2.9–4.4)
Alpha 1: 0.2 g/dL (ref 0.0–0.4)
Alpha 2: 0.7 g/dL (ref 0.4–1.0)
Beta: 1 g/dL (ref 0.7–1.3)
Gamma Globulin: 1.1 g/dL (ref 0.4–1.8)
Globulin, Total: 3.1 g/dL (ref 2.2–3.9)
Total Protein: 6.8 g/dL (ref 6.0–8.5)

## 2024-04-20 LAB — ALPHA-GAL PANEL
Allergen Lamb IgE: <0.1 kU/L
Beef IgE: <0.1 kU/L
IgE (Immunoglobulin E), Serum: 887 [IU]/mL — ABNORMAL HIGH (ref 6–495)
O215-IgE Alpha-Gal: 0.9 kU/L — AB
Pork IgE: <0.1 kU/L

## 2024-04-20 LAB — KIT (D816V) DIGITAL PCR: CKIT Result: NEGATIVE

## 2024-04-20 LAB — ALLERGEN, MANGO, F91: Mango IgE: <0.1 kU/L

## 2024-04-20 LAB — ALLERGEN, KIWI FRUIT, F84: Kiwi Fruit: <0.1 kU/L

## 2024-04-20 LAB — TRYPTASE: Tryptase: 6.1 ug/L (ref 2.2–13.2)

## 2024-04-20 LAB — C-REACTIVE PROTEIN: CRP: 1 mg/L (ref 0–10)

## 2024-04-20 LAB — SEDIMENTATION RATE: Sed Rate: 9 mm/h (ref 0–32)

## 2024-04-20 LAB — ALLERGEN, PINEAPPLE, F210: Pineapple IgE: <0.1 kU/L

## 2024-04-20 LAB — PROSTAGLANDIN D2, SERUM: Prostaglandin D2, serum: 36 pg/mL

## 2024-04-21 LAB — OTHER LAB TEST

## 2024-04-22 ENCOUNTER — Ambulatory Visit (INDEPENDENT_AMBULATORY_CARE_PROVIDER_SITE_OTHER): Admitting: Allergy & Immunology

## 2024-04-22 ENCOUNTER — Encounter: Payer: Self-pay | Admitting: Allergy & Immunology

## 2024-04-22 DIAGNOSIS — J302 Other seasonal allergic rhinitis: Secondary | ICD-10-CM

## 2024-04-22 DIAGNOSIS — D8940 Mast cell activation, unspecified: Secondary | ICD-10-CM | POA: Diagnosis not present

## 2024-04-22 DIAGNOSIS — J3089 Other allergic rhinitis: Secondary | ICD-10-CM | POA: Diagnosis not present

## 2024-04-22 DIAGNOSIS — T7800XA Anaphylactic reaction due to unspecified food, initial encounter: Secondary | ICD-10-CM

## 2024-04-22 DIAGNOSIS — T7800XD Anaphylactic reaction due to unspecified food, subsequent encounter: Secondary | ICD-10-CM

## 2024-04-22 NOTE — Progress Notes (Signed)
 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 Mikel)   Recurrent infections - including shingles     Plan/Recommendations:   1. Anaphylaxis due to food (pineapple, mango, kiwi) - Labs were negative and testing to pineapple on the skin was negative.  - Testing was negative to the most common foods as well, ruling out more than 95% of all food allergies. - Copy of testing results provided.   2. Concern for mast cell activation syndrome - All of the labs thus far were negative which I guess is good news.  - Alpha gal was slightly positive, but it was not super high so I am not sure how excited to get about it.  - Definitely start suppressive antihistamine regimen to see if this helps at all.  - Morning: Zyrtec  (10mg ) and Pepcid  (famotidine ) 20mg   - Evening: Zyrtec  (10mg ) and Pepcid  (famotidine ) 20mg  - You can change this dosing at home, decreasing the dose as needed or increasing the dosing as needed.  - If you are not tolerating the medications or are tired of taking them every day, we can start treatment with a monthly injectable medication called Xolair.  3. Chronic rhinitis - Testing was positive to weeds, trees, and one indoor mold. - Copy of testing results provided. - The antihistamines should help.   4. Return in about 3 months (around 07/22/2024). You can have the follow up appointment with Dr. Iva or a Nurse Practicioner (our Nurse Practitioners are excellent and always have Physician oversight!).    Subjective:   Natalie Foster is a 38 y.o. female presenting today for follow up of No chief complaint on file.   Natalie Foster has a history of the following: Patient Active Problem List   Diagnosis Date Noted   Boil 01/21/2024    Class 1 obesity due to excess calories without serious comorbidity with body mass index (BMI) of 33.0 to 33.9 in adult 11/11/2021   Mixed hyperlipidemia 11/11/2021   Abdominal pain, RLQ (right lower quadrant) 05/11/2018    History obtained from: chart review and patient.  Discussed the use of AI scribe software for clinical note transcription with the patient and/or guardian, who gave verbal consent to proceed.  [name] is a 38 y.o. female presenting for skin testing. She was last seen on August 20th. We could not do testing because her insurance company does not cover testing on the same day as a New Patient visit. She has been off of all antihistamines 3 days in anticipation of the testing.   At that visit, she was concerned with mast cell activation syndrome.  We obtained a lot of labs which have all returned normal thankfully.  We did start her on suppressive antihistamines including Zyrtec  with Pepcid  twice daily to see if this would help.  She had a history of anaphylaxis to certain foods including pineapple, mango, and kiwi.  For her rhinitis, we decided to do environmental allergy testing.  She experiences allergic reactions to certain foods and environmental factors, including kiwi and fresh pineapple, which she associates with oral allergy syndrome. Despite testing negative for birch pollen allergy, she suspects cross-reactivity with birch pollen and prefers to avoid these foods.  She has a history of mild environmental allergies to some weeds, a tree, and one  mold, but these have not been severe recently. No significant issues with her environmental allergies lately.  She has been prescribed Zyrtec  to be taken twice a day but has not started it yet. Her symptoms subsided during a recent trip to [place] , where she was active and on the go.  Prior to her trip, she underwent blood and urine tests. The urine test was contaminated, and the urine protein electrophoresis (UPEP) was not  completed. Her serum protein electrophoresis (SPEP) was normal. Her alpha-gal test was barely positive, but she has not noticed any issues with red meat consumption.  She mentions a high IgE level of 887. Her skin is very sensitive, reacting to items like her watch.  Otherwise, there have been no changes to her past medical history, surgical history, family history, or social history.    Objective:   There were no vitals taken for this visit. There is no height or weight on file to calculate BMI.    Physical exam deferred since this was a skin testing appointment only.   Diagnostic studies:   Allergy Studies:     Airborne Adult Perc - 04/22/24 1458     Time Antigen Placed 1458    Allergen Manufacturer Jestine    Location Back    Number of Test 55    1. Control-Buffer 50% Glycerol Negative    2. Control-Histamine 2+    3. Bahia Negative    4. French Southern Territories Negative    5. Johnson Negative    6. [place]  Blue Negative    7. Meadow Fescue Negative    8. Perennial Rye Negative    9. Timothy Negative    10. Ragweed Mix Negative    11. Cocklebur Negative    12. Plantain,  English 2+    13. Baccharis Negative    14. Dog Fennel Negative    15. Russian Thistle Negative    16. Lamb's Quarters Negative    17. Sheep Sorrell 2+    18. Rough Pigweed Negative    19. Marsh Elder, Rough 2+    20. Mugwort, Common Negative    21. Box, Elder Negative    22. Cedar, red Negative    23. Sweet Gum Negative    24. Pecan Pollen Negative    25. Pine Mix 2+    26. Walnut, Black Pollen Negative    27. Red Mulberry Negative    28. Ash Mix Negative    29. Birch Mix Negative    30. Beech American Negative    31. Cottonwood, Guinea-Bissau Negative    32. Hickory, White Negative    33. Maple Mix Negative    34. Oak, [place]iss[place] Mix Negative    35. Sycamore Eastern Negative    36. Alternaria Alternata Negative    37. Cladosporium Herbarum Negative    38. Aspergillus Mix 2+    39. Penicillium Mix Negative     40. Bipolaris Sorokiniana (Helminthosporium) Negative    41. Drechslera Spicifera (Curvularia) Negative    42. Mucor Plumbeus Negative    43. Fusarium Moniliforme Negative    44. Aureobasidium Pullulans (pullulara) Negative    45. Rhizopus Oryzae Negative    46. Botrytis Cinera Negative    47. Epicoccum Nigrum Negative    48. Phoma Betae Negative    49. Dust Mite Mix Negative    50. Cat Hair 10,000 BAU/ml Negative    51.  Dog Epithelia Negative    52. Mixed Feathers Negative    53. Horse Epithelia  Negative    54. Cockroach, German Negative    55. Tobacco Leaf Negative          Food Adult Perc - 04/22/24 1500     Time Antigen Placed 1518    Allergen Manufacturer Jestine    Location Back    Number of allergen test 18    1. Peanut Negative    2. Soybean Negative    3. Wheat Negative    4. Sesame Negative    5. Milk, Cow Negative    6. Casein Negative    7. Egg White, Chicken Negative    8. Shellfish Mix Negative    9. Fish Mix Negative    10. Cashew Negative    11. Walnut Food Negative    12. Almond Negative    13. Hazelnut Negative    14. Pecan Food Negative    15. Pistachio Negative    16. [place] Nut Negative    17. Coconut Negative    65. Pineapple Negative          Allergy testing results were read and interpreted by myself, documented by clinical staff.      Marty Shaggy, MD  [facility] 

## 2024-04-22 NOTE — Patient Instructions (Addendum)
 1. Anaphylaxis due to food (pineapple, mango, kiwi) - Labs were negative and testing to pineapple on the skin was negative.  - Testing was negative to the most common foods as well, ruling out more than 95% of all food allergies. - Copy of testing results provided.   2. Concern for mast cell activation syndrome - All of the labs thus far were negative which I guess is good news.  - Alpha gal was slightly positive, but it was not super high so I am not sure how excited to get about it.  - Definitely start suppressive antihistamine regimen to see if this helps at all.  - Morning: Zyrtec  (10mg ) and Pepcid  (famotidine ) 20mg   - Evening: Zyrtec  (10mg ) and Pepcid  (famotidine ) 20mg  - You can change this dosing at home, decreasing the dose as needed or increasing the dosing as needed.  - If you are not tolerating the medications or are tired of taking them every day, we can start treatment with a monthly injectable medication called Xolair.  3. Chronic rhinitis - Testing was positive to weeds, trees, and one indoor mold. - Copy of testing results provided. - The antihistamines should help.   4. Return in about 3 months (around 07/22/2024). You can have the follow up appointment with Dr. Iva or a Nurse Practicioner (our Nurse Practitioners are excellent and always have Physician oversight!).    Please inform us  of any Emergency Department visits, hospitalizations, or changes in symptoms. Call us  before going to the ED for breathing or allergy symptoms since we might be able to fit you in for a sick visit. Feel free to contact us  anytime with any questions, problems, or concerns.  It was a pleasure to see you again today!  Websites that have reliable patient information: 1. American Academy of Asthma, Allergy, and Immunology: www.aaaai.org 2. Food Allergy Research and Education (FARE): foodallergy.org 3. Mothers of Asthmatics: http://www.asthmacommunitynetwork.org 4. American College of  Allergy, Asthma, and Immunology: www.acaai.org      "Like" us  on Facebook and Instagram for our latest updates!      A healthy democracy works best when [employer] participate! Make sure you are registered to vote! If you have moved or changed any of your contact information, you will need to get this updated before voting! Scan the QR codes below to learn more!        Airborne Adult Perc - 04/22/24 1458     Time Antigen Placed 1458    Allergen Manufacturer Jestine    Location Back    Number of Test 55    1. Control-Buffer 50% Glycerol Negative    2. Control-Histamine 2+    3. Bahia Negative    4. French Southern Territories Negative    5. Johnson Negative    6. [place]  Blue Negative    7. Meadow Fescue Negative    8. Perennial Rye Negative    9. Timothy Negative    10. Ragweed Mix Negative    11. Cocklebur Negative    12. Plantain,  English 2+    13. Baccharis Negative    14. Dog Fennel Negative    15. Russian Thistle Negative    16. Lamb's Quarters Negative    17. Sheep Sorrell 2+    18. Rough Pigweed Negative    19. Marsh Elder, Rough 2+    20. Mugwort, Common Negative    21. Box, Elder Negative    22. Cedar, red Negative    23. Sweet Gum Negative  24. Pecan Pollen Negative    25. Pine Mix 2+    26. Walnut, Black Pollen Negative    27. Red Mulberry Negative    28. Ash Mix Negative    29. Birch Mix Negative    30. Beech American Negative    31. Cottonwood, Guinea-Bissau Negative    32. Hickory, White Negative    33. Maple Mix Negative    34. Oak, [place] Mix Negative    35. Sycamore Eastern Negative    36. Alternaria Alternata Negative    37. Cladosporium Herbarum Negative    38. Aspergillus Mix 2+    39. Penicillium Mix Negative    40. Bipolaris Sorokiniana (Helminthosporium) Negative    41. Drechslera Spicifera (Curvularia) Negative    42. Mucor Plumbeus Negative    43. Fusarium Moniliforme Negative    44. Aureobasidium Pullulans (pullulara) Negative    45. Rhizopus  Oryzae Negative    46. Botrytis Cinera Negative    47. Epicoccum Nigrum Negative    48. Phoma Betae Negative    49. Dust Mite Mix Negative    50. Cat Hair 10,000 BAU/ml Negative    51.  Dog Epithelia Negative    52. Mixed Feathers Negative    53. Horse Epithelia Negative    54. Cockroach, German Negative    55. Tobacco Leaf Negative          Food Adult Perc - 04/22/24 1500     Time Antigen Placed 1518    Allergen Manufacturer Jestine    Location Back    Number of allergen test 18    1. Peanut Negative    2. Soybean Negative    3. Wheat Negative    4. Sesame Negative    5. Milk, Cow Negative    6. Casein Negative    7. Egg White, Chicken Negative    8. Shellfish Mix Negative    9. Fish Mix Negative    10. Cashew Negative    11. Walnut Food Negative    12. Almond Negative    13. Hazelnut Negative    14. Pecan Food Negative    15. Pistachio Negative    16. [place] Nut Negative    17. Coconut Negative    65. Pineapple Negative

## 2024-04-25 NOTE — Addendum Note (Signed)
 Addended by: JENEL MARYLYNN GRADE on: 04/25/2024 04:40 PM   Modules accepted: Orders

## 2024-04-26 LAB — UPEP/TP, 24-HR URINE
Albumin, U: 30.8 %
Alpha 1, Urine: 7.4 %
Alpha 2, Urine: 18 %
Beta, Urine: 26.9 %
Gamma Globulin, Urine: 16.8 %
Protein, 24H Urine: 288 mg/(24.h) — ABNORMAL HIGH (ref 30–150)
Protein, Ur: 20.6 mg/dL

## 2024-04-26 LAB — N-METHYLHISTAMINE, 24 HR, U
Collection Duration (h): 24 h
Creatinine Concent. 24 Hr, U: 120 mg/dL
Creatinine, 24 Hour, U: 1680 mg/(24.h) (ref 603–1783)
N-Methylhistamine, 24 Hr, U: 101 ug/g{creat} (ref 30–200)
Urine Volume (mL): 1400 mL

## 2024-04-26 LAB — LEUKOTRIENE E4, 24 HR, U
Collection Duration: 24 h
Creatinine Concentration,24 HR: 123 mg/dL
Creatinine, 24 HR, U: 1722 mg/(24.h) (ref 603–1783)
Leukotriene E4, U: 38 pg/mg{creat} (ref <=104)
Urine Volume: 1400 mL

## 2024-04-26 LAB — PROSTAGLANDIN D2/CREATININE, U
Creatinine, Urine: 142 mg/dL
Prostaglandin D2, urine: 8.3 pg/mL
Prostaglandin D2/Cr Ratio: 5.8 ng/g

## 2024-04-28 DIAGNOSIS — F411 Generalized anxiety disorder: Secondary | ICD-10-CM | POA: Diagnosis not present

## 2024-04-28 DIAGNOSIS — F900 Attention-deficit hyperactivity disorder, predominantly inattentive type: Secondary | ICD-10-CM | POA: Diagnosis not present

## 2024-04-29 DIAGNOSIS — Z419 Encounter for procedure for purposes other than remedying health state, unspecified: Secondary | ICD-10-CM | POA: Diagnosis not present

## 2024-05-03 DIAGNOSIS — F411 Generalized anxiety disorder: Secondary | ICD-10-CM | POA: Diagnosis not present

## 2024-05-03 DIAGNOSIS — F909 Attention-deficit hyperactivity disorder, unspecified type: Secondary | ICD-10-CM | POA: Diagnosis not present

## 2024-05-29 DIAGNOSIS — Z419 Encounter for procedure for purposes other than remedying health state, unspecified: Secondary | ICD-10-CM | POA: Diagnosis not present

## 2024-05-31 DIAGNOSIS — R2241 Localized swelling, mass and lump, right lower limb: Secondary | ICD-10-CM | POA: Diagnosis not present

## 2024-06-01 DIAGNOSIS — H9319 Tinnitus, unspecified ear: Secondary | ICD-10-CM | POA: Diagnosis not present

## 2024-06-01 DIAGNOSIS — G43909 Migraine, unspecified, not intractable, without status migrainosus: Secondary | ICD-10-CM | POA: Diagnosis not present

## 2024-06-01 DIAGNOSIS — H6590 Unspecified nonsuppurative otitis media, unspecified ear: Secondary | ICD-10-CM | POA: Diagnosis not present

## 2024-06-14 DIAGNOSIS — F411 Generalized anxiety disorder: Secondary | ICD-10-CM | POA: Diagnosis not present

## 2024-06-14 DIAGNOSIS — F900 Attention-deficit hyperactivity disorder, predominantly inattentive type: Secondary | ICD-10-CM | POA: Diagnosis not present

## 2024-06-29 DIAGNOSIS — Z419 Encounter for procedure for purposes other than remedying health state, unspecified: Secondary | ICD-10-CM | POA: Diagnosis not present

## 2024-06-30 DIAGNOSIS — F411 Generalized anxiety disorder: Secondary | ICD-10-CM | POA: Diagnosis not present

## 2024-06-30 DIAGNOSIS — F900 Attention-deficit hyperactivity disorder, predominantly inattentive type: Secondary | ICD-10-CM | POA: Diagnosis not present

## 2024-07-05 DIAGNOSIS — Z23 Encounter for immunization: Secondary | ICD-10-CM | POA: Diagnosis not present

## 2024-07-05 DIAGNOSIS — Z0184 Encounter for antibody response examination: Secondary | ICD-10-CM | POA: Diagnosis not present

## 2024-07-05 DIAGNOSIS — Z7721 Contact with and (suspected) exposure to potentially hazardous body fluids: Secondary | ICD-10-CM | POA: Diagnosis not present

## 2024-07-11 DIAGNOSIS — R21 Rash and other nonspecific skin eruption: Secondary | ICD-10-CM | POA: Diagnosis not present

## 2024-07-11 DIAGNOSIS — M359 Systemic involvement of connective tissue, unspecified: Secondary | ICD-10-CM | POA: Diagnosis not present

## 2024-07-11 DIAGNOSIS — R5383 Other fatigue: Secondary | ICD-10-CM | POA: Diagnosis not present

## 2024-07-11 DIAGNOSIS — M3500 Sicca syndrome, unspecified: Secondary | ICD-10-CM | POA: Diagnosis not present

## 2024-07-11 DIAGNOSIS — E559 Vitamin D deficiency, unspecified: Secondary | ICD-10-CM | POA: Diagnosis not present

## 2024-07-11 DIAGNOSIS — M2550 Pain in unspecified joint: Secondary | ICD-10-CM | POA: Diagnosis not present

## 2024-07-29 ENCOUNTER — Ambulatory Visit: Admitting: Allergy & Immunology

## 2024-08-02 DIAGNOSIS — Z23 Encounter for immunization: Secondary | ICD-10-CM | POA: Diagnosis not present

## 2024-09-13 NOTE — Progress Notes (Signed)
 "  [address] AZALEA LUBA BROCKS [place] [address] Dept: [phone]  FOLLOW UP NOTE  Patient ID: Burnard JONELLE Rummer, female    DOB: 1986/05/22  Age: 39 y.o. MRN: [id number] Date of Office Visit: 09/14/2024  Assessment  Chief Complaint: No chief complaint on file.  HPI IRMGARD RAMPERSAUD is a 39 year old female who presents to the clinic for follow-up visit.  She was last seen in this clinic on 12/2023 by Dr. Iva for evaluation of MCAS, food allergy , connective tissue disorder, recurrent infection, and allergic rhinitis.  Her last environmental allergy  skin testing on 04/22/2024 was positive to weed pollen, tree pollen, and indoor mold.  Her last food allergy  skin testing was negative to pineapple and the top most allergenic foods  Her last food allergy  testing via lab on 04/06/2024 was negative to kiwi, pineapple, and mango.  Alpha gal IgE 0.90 on 04/06/2024.  Other than slightly elevated protein, urine studies within normal limits.    Discussed the use of AI scribe software for clinical note transcription with the patient, who gave verbal consent to proceed.  History of Present Illness VERLISA VARA is a 39 year old female with multiple autoimmune conditions who presents with recurrent hives and scalp issues.  She experiences recurrent hives whenever she contracts a virus or has seasonal allergies. Recently, she had a viral illness, suspected to be the flu, which lasted about seven days. During this time, hives appeared in different parts of her body and resolved once the illness subsided. She was taking cold medicine containing an antihistamine, which seemed to help with the hives. She intermittently uses antihistamines, particularly during periods when her symptoms are more frequent, such as between August and October.  She has a history of Sjogren's syndrome and is undergoing testing for lupus. She experiences sores both inside and outside her body, which she initially thought  were fever sores, but clinicians have suggested may be related to her autoimmune conditions. These symptoms occur randomly and are not well understood. She follows an anti-inflammatory diet for her autoimmune conditions, which she feels helps when adhered to strictly.  She has been experiencing significant scalp issues, described as dry skin with large flakes, primarily along her hairline. She has tried various treatments, including ketoconazole shampoo, Head & Shoulders, and Nexium, but these have not provided relief. The condition is not itchy but feels tight and unsightly. She is unsure if it is dandruff or related to her autoimmune conditions.  She avoids certain fruits like pineapple, mango, and kiwi due to allergic reactions, which cause her mouth to feel itchy and result in hives. These reactions did not appear on autoimmune testing but are managed by dietary avoidance.  In September, she experienced an ear issue that felt like tinnitus and required treatment with Sudafed and steroids.     Drug Allergies:  Allergies[1]  Physical Exam: There were no vitals taken for this visit.   Physical Exam  Diagnostics:    Assessment and Plan: No diagnosis found.  No orders of the defined types were placed in this encounter.   There are no Patient Instructions on file for this visit.  No follow-ups on file.    Thank you for the opportunity to care for this patient.  Please do not hesitate to contact me with questions.  Arlean Mutter, FNP [facility]           [1]  Allergies Allergen Reactions   Cortisone Nausea And Vomiting  Kiwi Extract    Mango Flavor [Flavoring Agent]    Pineapple Hives and Itching    Also mango and kiwi   "

## 2024-09-13 NOTE — Patient Instructions (Incomplete)
 Food intolerance Continue to avoid foods that bother you  Concern for MCAS Continue suppressive doses of antihistamines as you have been Consider Xolair for control of symptoms of flushing and hives  Recurrent infection  Allergic rhinitis Continue allergen avoidance measures directed toward weed pollen, tree pollen, and indoor mold as listed below Continue antihistamines as listed above Consider Flonase nasal spray 1 spray in each nostril once a day if needed for stuffy nose.  In the right nostril, point the applicator out toward the right ear. In the left nostril, point the applicator out toward the left ear Consider saline nasal rinses as needed for nasal symptoms. Use this before any medicated nasal sprays for best result

## 2024-09-14 ENCOUNTER — Encounter: Payer: Self-pay | Admitting: Family Medicine

## 2024-09-14 ENCOUNTER — Ambulatory Visit: Admitting: Family Medicine

## 2024-09-14 ENCOUNTER — Other Ambulatory Visit: Payer: Self-pay

## 2024-09-14 VITALS — BP 104/82 | HR 82 | Temp 98.4°F | Wt 187.4 lb

## 2024-09-14 DIAGNOSIS — L209 Atopic dermatitis, unspecified: Secondary | ICD-10-CM

## 2024-09-14 DIAGNOSIS — J3089 Other allergic rhinitis: Secondary | ICD-10-CM | POA: Diagnosis not present

## 2024-09-14 DIAGNOSIS — J302 Other seasonal allergic rhinitis: Secondary | ICD-10-CM

## 2024-09-14 DIAGNOSIS — K9049 Malabsorption due to intolerance, not elsewhere classified: Secondary | ICD-10-CM | POA: Diagnosis not present

## 2024-09-14 DIAGNOSIS — B999 Unspecified infectious disease: Secondary | ICD-10-CM

## 2024-09-14 DIAGNOSIS — L508 Other urticaria: Secondary | ICD-10-CM | POA: Diagnosis not present

## 2024-09-14 MED ORDER — FLUOCINOLONE ACETONIDE BODY 0.01 % EX OIL: 1.0000 | TOPICAL_OIL | Freq: Every day | CUTANEOUS | 1 refills | Status: AC | PRN

## 2024-09-15 ENCOUNTER — Encounter: Payer: Self-pay | Admitting: Family Medicine

## 2024-09-15 DIAGNOSIS — K9049 Malabsorption due to intolerance, not elsewhere classified: Secondary | ICD-10-CM | POA: Insufficient documentation

## 2024-09-15 DIAGNOSIS — L508 Other urticaria: Secondary | ICD-10-CM | POA: Insufficient documentation

## 2024-09-15 DIAGNOSIS — L209 Atopic dermatitis, unspecified: Secondary | ICD-10-CM | POA: Insufficient documentation

## 2024-09-15 DIAGNOSIS — B999 Unspecified infectious disease: Secondary | ICD-10-CM | POA: Insufficient documentation

## 2024-09-15 DIAGNOSIS — J3089 Other allergic rhinitis: Secondary | ICD-10-CM | POA: Insufficient documentation

## 2025-02-13 ENCOUNTER — Ambulatory Visit: Admitting: Physician Assistant
